# Patient Record
Sex: Female | Born: 1945 | Hispanic: No | Smoking: Never smoker
Health system: Southern US, Community
[De-identification: ages and names within clinical notes are randomized; demographics above are authoritative.]

## PROBLEM LIST (undated history)

## (undated) DIAGNOSIS — M8580 Other specified disorders of bone density and structure, unspecified site: Secondary | ICD-10-CM

## (undated) DIAGNOSIS — E0590 Thyrotoxicosis, unspecified without thyrotoxic crisis or storm: Secondary | ICD-10-CM

## (undated) DIAGNOSIS — M779 Enthesopathy, unspecified: Secondary | ICD-10-CM

## (undated) HISTORY — DX: Other specified disorders of bone density and structure, unspecified site: M85.80

## (undated) HISTORY — DX: Enthesopathy, unspecified: M77.9

---

## 1999-08-08 ENCOUNTER — Encounter: Payer: Self-pay | Admitting: Obstetrics and Gynecology

## 1999-08-08 ENCOUNTER — Encounter: Admission: RE | Admit: 1999-08-08 | Discharge: 1999-08-08 | Payer: Self-pay | Admitting: Obstetrics and Gynecology

## 2000-08-03 ENCOUNTER — Other Ambulatory Visit: Admission: RE | Admit: 2000-08-03 | Discharge: 2000-08-03 | Payer: Self-pay | Admitting: Obstetrics and Gynecology

## 2003-05-29 ENCOUNTER — Other Ambulatory Visit: Admission: RE | Admit: 2003-05-29 | Discharge: 2003-05-29 | Payer: Self-pay | Admitting: Obstetrics and Gynecology

## 2003-06-15 ENCOUNTER — Encounter: Payer: Self-pay | Admitting: Obstetrics and Gynecology

## 2003-06-15 ENCOUNTER — Encounter: Admission: RE | Admit: 2003-06-15 | Discharge: 2003-06-15 | Payer: Self-pay | Admitting: Obstetrics and Gynecology

## 2004-07-02 ENCOUNTER — Ambulatory Visit (HOSPITAL_COMMUNITY): Admission: RE | Admit: 2004-07-02 | Discharge: 2004-07-02 | Payer: Self-pay | Admitting: Obstetrics and Gynecology

## 2004-07-29 ENCOUNTER — Emergency Department (HOSPITAL_COMMUNITY): Admission: EM | Admit: 2004-07-29 | Discharge: 2004-07-29 | Payer: Self-pay | Admitting: Emergency Medicine

## 2005-12-14 ENCOUNTER — Other Ambulatory Visit: Admission: RE | Admit: 2005-12-14 | Discharge: 2005-12-14 | Payer: Self-pay | Admitting: Obstetrics and Gynecology

## 2006-01-21 ENCOUNTER — Ambulatory Visit: Payer: Self-pay | Admitting: Gastroenterology

## 2006-02-08 ENCOUNTER — Ambulatory Visit: Payer: Self-pay | Admitting: Gastroenterology

## 2007-01-11 ENCOUNTER — Ambulatory Visit (HOSPITAL_COMMUNITY): Admission: RE | Admit: 2007-01-11 | Discharge: 2007-01-11 | Payer: Self-pay | Admitting: Obstetrics and Gynecology

## 2009-04-05 ENCOUNTER — Ambulatory Visit (HOSPITAL_COMMUNITY): Admission: RE | Admit: 2009-04-05 | Discharge: 2009-04-05 | Payer: Self-pay | Admitting: Obstetrics and Gynecology

## 2009-04-17 ENCOUNTER — Encounter (INDEPENDENT_AMBULATORY_CARE_PROVIDER_SITE_OTHER): Payer: Self-pay | Admitting: Interventional Radiology

## 2009-04-17 ENCOUNTER — Other Ambulatory Visit: Admission: RE | Admit: 2009-04-17 | Discharge: 2009-04-17 | Payer: Self-pay | Admitting: Interventional Radiology

## 2009-04-17 ENCOUNTER — Encounter: Admission: RE | Admit: 2009-04-17 | Discharge: 2009-04-17 | Payer: Self-pay | Admitting: Obstetrics and Gynecology

## 2009-04-18 ENCOUNTER — Ambulatory Visit (HOSPITAL_COMMUNITY): Admission: RE | Admit: 2009-04-18 | Discharge: 2009-04-18 | Payer: Self-pay | Admitting: Obstetrics and Gynecology

## 2009-09-11 ENCOUNTER — Other Ambulatory Visit (HOSPITAL_COMMUNITY): Payer: Self-pay | Admitting: Interventional Radiology

## 2009-09-11 ENCOUNTER — Other Ambulatory Visit: Admission: RE | Admit: 2009-09-11 | Discharge: 2009-09-11 | Payer: Self-pay | Admitting: Interventional Radiology

## 2009-09-11 ENCOUNTER — Encounter: Admission: RE | Admit: 2009-09-11 | Discharge: 2009-09-11 | Payer: Self-pay | Admitting: Internal Medicine

## 2010-04-22 ENCOUNTER — Ambulatory Visit (HOSPITAL_COMMUNITY): Admission: RE | Admit: 2010-04-22 | Discharge: 2010-04-22 | Payer: Self-pay | Admitting: Obstetrics and Gynecology

## 2010-10-07 IMAGING — US US SOFT TISSUE HEAD/NECK
1 series · 13 of 25 positions shown · non-contrast
Comparison: 01/11/2007

CLINICAL DATA: Thyroid enlargement and known bilateral thyroid
nodules by prior ultrasound.

THYROID ULTRASOUND
TECHNIQUE: Ultrasound examination of the thyroid gland and
adjacent soft tissues was performed.

[Series 1: us soft tissue head/neck · 0.13mm/px · 13 of 35 slices shown]
[im 1/35]
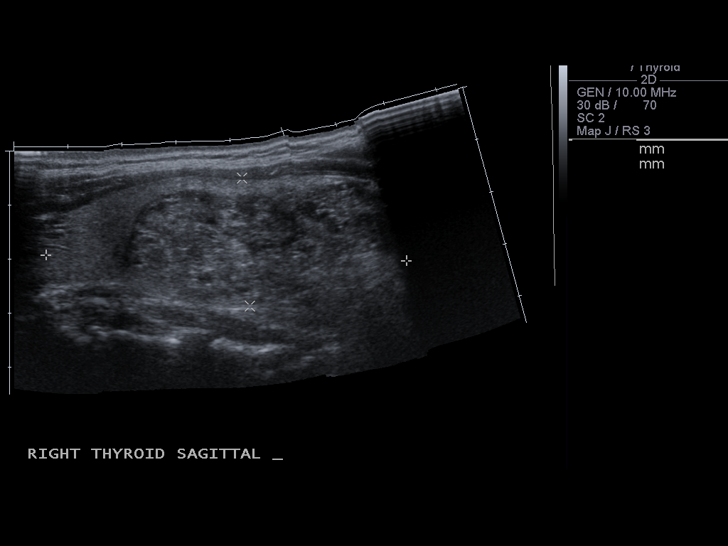
[im 3/35]
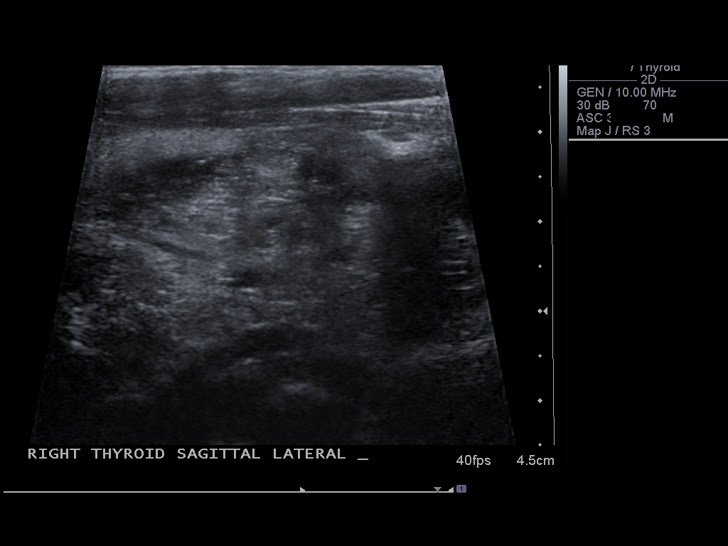
[im 6/35]
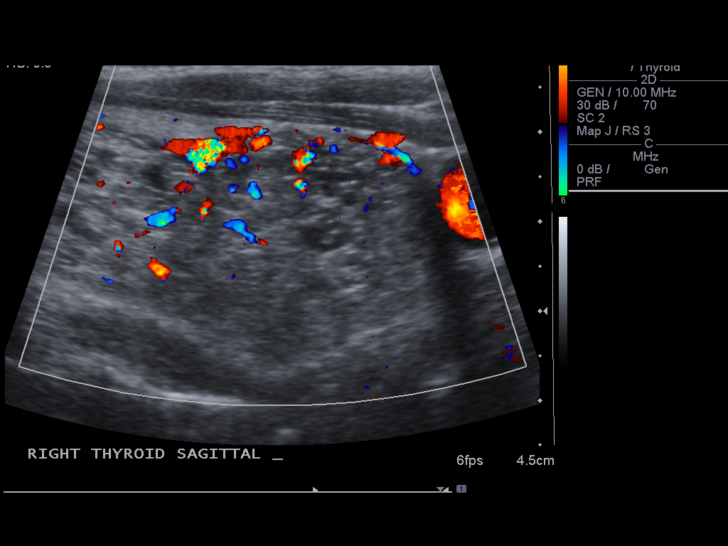
[im 9/35]
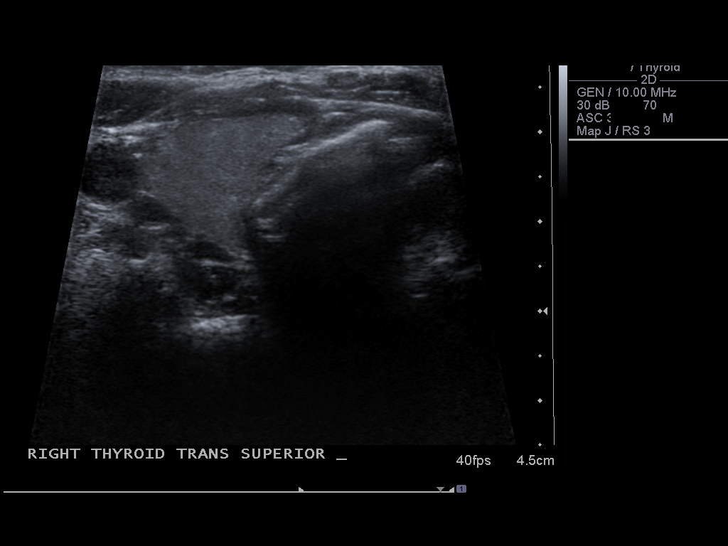
[im 12/35]
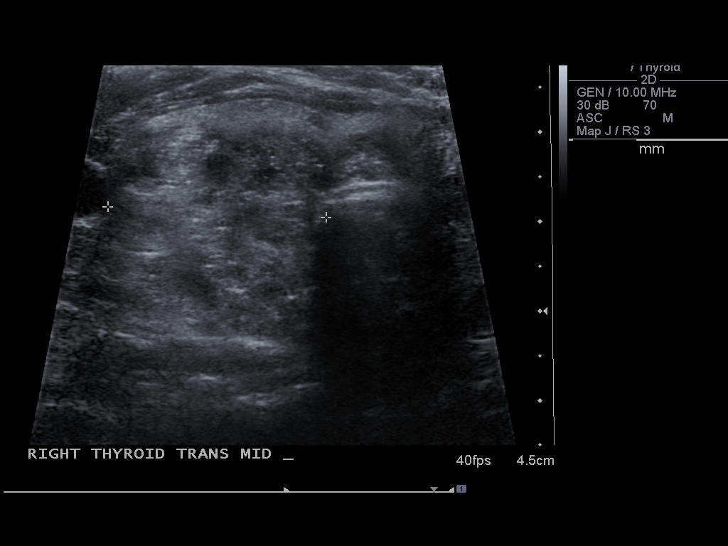
[im 15/35]
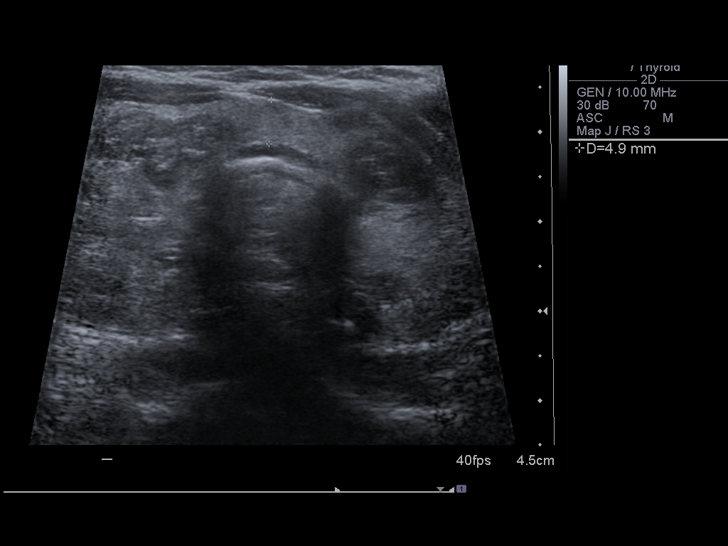
[im 18/35]
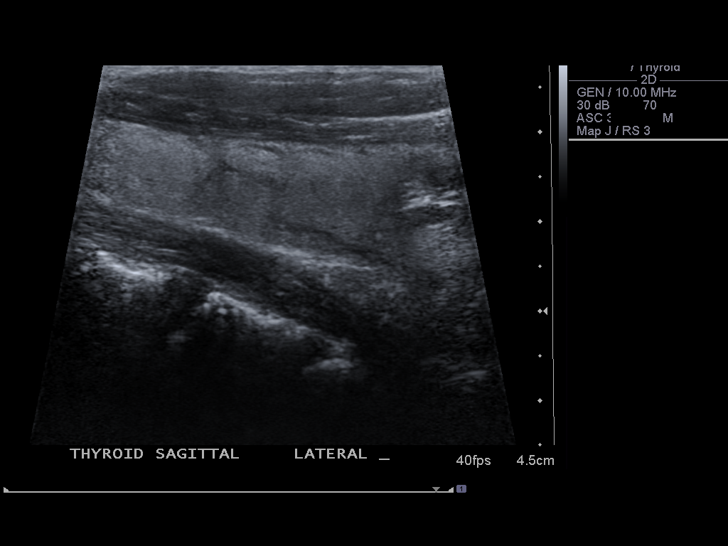
[im 20/35]
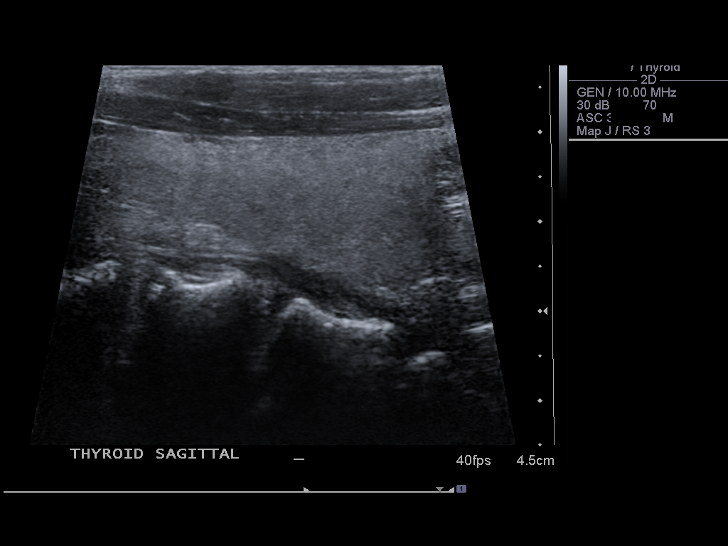
[im 23/35]
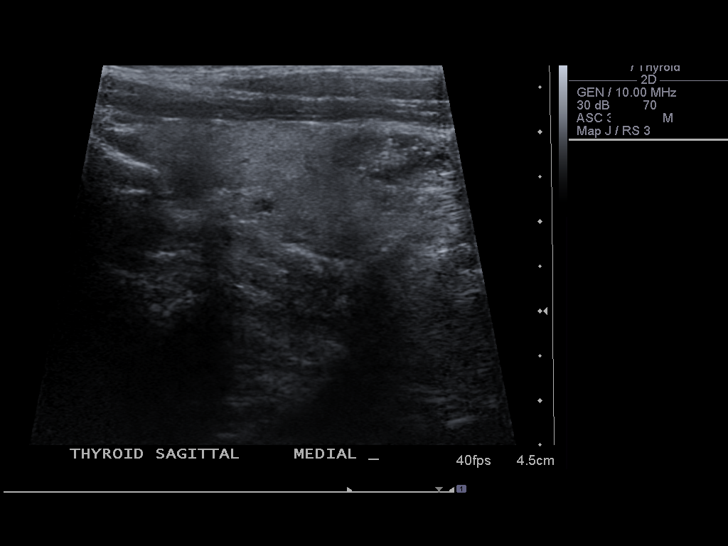
[im 26/35]
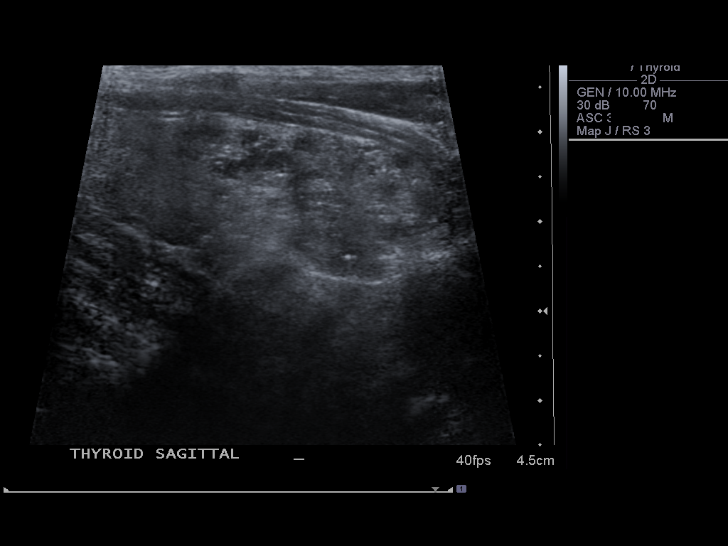
[im 29/35]
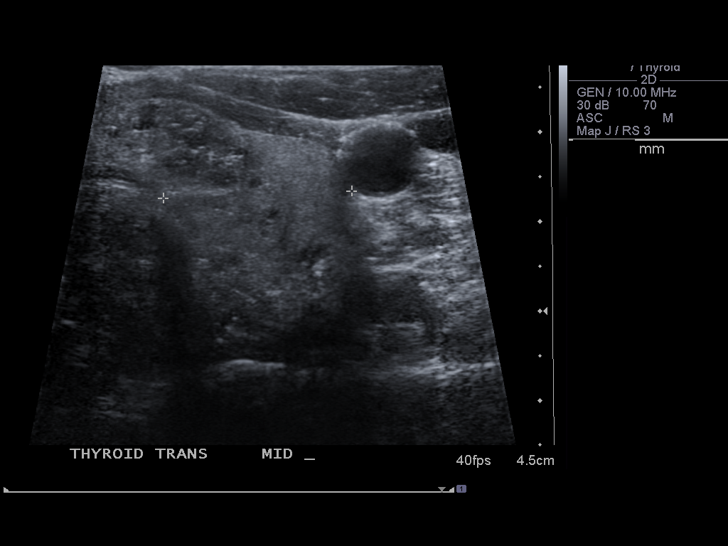
[im 32/35]
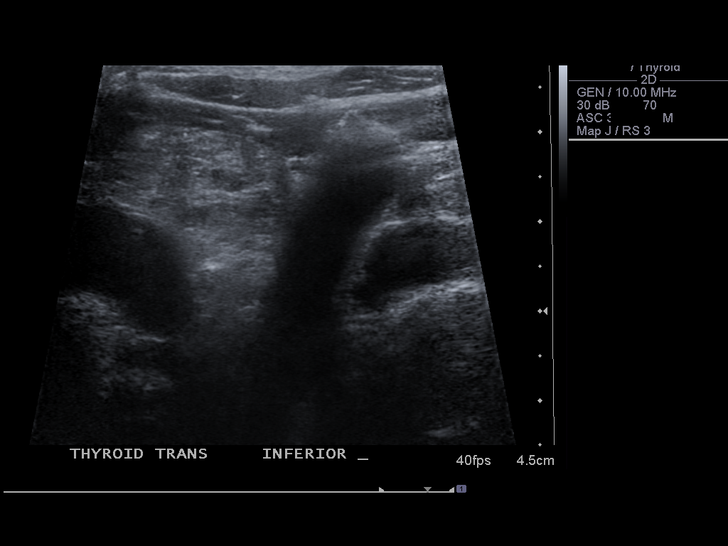
[im 35/35]
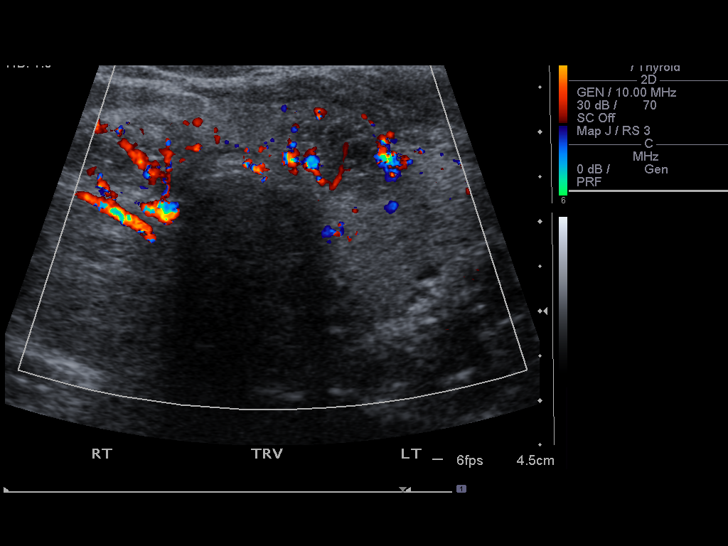

[13 of 25 positions shown; findings below may reference images not displayed]

FINDINGS: Right lobe:  6.7 x 2.4 x 2.8 cm

Left lobe:  6.1 x 1.9 x 2.1 cm

Thyroid isthmus:  0.5 cm

Due to heterogeneous nature of tissue, it is difficult to
accurately measure the dimension of thyroid nodules.  Much of the
right lobe is replaced by nodular heterogeneous tissue measuring
approximately 4.8 x 2.2 x 2.4 cm.  This area has not changed
significantly in size or appearance since the prior ultrasound
study.

There also is suggestion of additional nodularity in the left lobe.
Within the lower pole, nodularity measures approximately 3.2 x
x 2.6 cm.  There also is contiguous adjacent nodularity extending
towards the isthmus measuring approximately 1.0 x 0.5 x 0.8 cm.
Dominant nodularity in the lower left lobe does appear more
prominent compared to the prior ultrasound.
IMPRESSION: 1.  Stable large area of nodular heterogeneous tissue in the right
lobe of the liver which has not changed significantly in size or
sonographic morphology.
2.  Increased prominence of nodularity in the lower pole of the
left lobe with some adjacent nodularity extending towards the
isthmus.  Given interval enlargement, I would consider ultrasound
guided biopsy of the lower left thyroid nodule.

## 2010-10-14 ENCOUNTER — Other Ambulatory Visit: Payer: Self-pay | Admitting: Internal Medicine

## 2010-10-14 DIAGNOSIS — E042 Nontoxic multinodular goiter: Secondary | ICD-10-CM

## 2010-10-22 ENCOUNTER — Ambulatory Visit
Admission: RE | Admit: 2010-10-22 | Discharge: 2010-10-22 | Disposition: A | Discharge status: Home / Self Care | Payer: Self-pay | Source: Ambulatory Visit | Attending: Internal Medicine | Admitting: Internal Medicine

## 2010-10-22 DIAGNOSIS — E042 Nontoxic multinodular goiter: Secondary | ICD-10-CM

## 2010-12-02 ENCOUNTER — Other Ambulatory Visit: Payer: Self-pay | Admitting: Internal Medicine

## 2010-12-02 DIAGNOSIS — E041 Nontoxic single thyroid nodule: Secondary | ICD-10-CM

## 2010-12-09 ENCOUNTER — Other Ambulatory Visit (HOSPITAL_COMMUNITY)
Admission: RE | Admit: 2010-12-09 | Discharge: 2010-12-09 | Disposition: A | Discharge status: Home / Self Care | Payer: BC Managed Care – PPO | Source: Ambulatory Visit | Attending: Interventional Radiology | Admitting: Interventional Radiology

## 2010-12-09 ENCOUNTER — Other Ambulatory Visit: Payer: Self-pay | Admitting: Interventional Radiology

## 2010-12-09 ENCOUNTER — Ambulatory Visit
Admission: RE | Admit: 2010-12-09 | Discharge: 2010-12-09 | Disposition: A | Discharge status: Home / Self Care | Payer: BC Managed Care – PPO | Source: Ambulatory Visit | Attending: Internal Medicine | Admitting: Internal Medicine

## 2010-12-09 DIAGNOSIS — E049 Nontoxic goiter, unspecified: Secondary | ICD-10-CM | POA: Insufficient documentation

## 2010-12-09 DIAGNOSIS — E041 Nontoxic single thyroid nodule: Secondary | ICD-10-CM

## 2011-01-22 ENCOUNTER — Other Ambulatory Visit (HOSPITAL_COMMUNITY): Payer: Self-pay | Admitting: Obstetrics and Gynecology

## 2011-01-22 DIAGNOSIS — N83209 Unspecified ovarian cyst, unspecified side: Secondary | ICD-10-CM

## 2011-01-28 ENCOUNTER — Ambulatory Visit (HOSPITAL_COMMUNITY): Payer: BC Managed Care – PPO

## 2011-01-28 ENCOUNTER — Other Ambulatory Visit (HOSPITAL_COMMUNITY): Payer: BC Managed Care – PPO

## 2011-02-12 ENCOUNTER — Other Ambulatory Visit (HOSPITAL_COMMUNITY): Payer: BC Managed Care – PPO

## 2011-02-12 ENCOUNTER — Ambulatory Visit (HOSPITAL_COMMUNITY): Admission: RE | Admit: 2011-02-12 | Payer: BC Managed Care – PPO | Source: Ambulatory Visit

## 2011-02-17 ENCOUNTER — Encounter (INDEPENDENT_AMBULATORY_CARE_PROVIDER_SITE_OTHER): Payer: Self-pay | Admitting: Surgery

## 2011-02-17 DIAGNOSIS — Z780 Asymptomatic menopausal state: Secondary | ICD-10-CM | POA: Insufficient documentation

## 2011-02-17 DIAGNOSIS — Z973 Presence of spectacles and contact lenses: Secondary | ICD-10-CM | POA: Insufficient documentation

## 2011-02-17 DIAGNOSIS — M1990 Unspecified osteoarthritis, unspecified site: Secondary | ICD-10-CM | POA: Insufficient documentation

## 2011-03-06 ENCOUNTER — Encounter (HOSPITAL_COMMUNITY): Payer: Self-pay

## 2011-03-06 ENCOUNTER — Other Ambulatory Visit: Payer: Self-pay

## 2011-03-06 ENCOUNTER — Encounter (HOSPITAL_COMMUNITY)
Admission: RE | Admit: 2011-03-06 | Discharge: 2011-03-06 | Disposition: A | Discharge status: Home / Self Care | Payer: BC Managed Care – PPO | Source: Ambulatory Visit | Attending: Obstetrics and Gynecology | Admitting: Obstetrics and Gynecology

## 2011-03-06 HISTORY — DX: Thyrotoxicosis, unspecified without thyrotoxic crisis or storm: E05.90

## 2011-03-06 LAB — DIFFERENTIAL
Eosinophils Relative: 5 % (ref 0–5)
Lymphocytes Relative: 43 % (ref 12–46)
Lymphs Abs: 2.1 10*3/uL (ref 0.7–4.0)
Monocytes Absolute: 0.3 10*3/uL (ref 0.1–1.0)
Neutro Abs: 2.3 10*3/uL (ref 1.7–7.7)

## 2011-03-06 LAB — CBC
Hemoglobin: 11.8 g/dL — ABNORMAL LOW (ref 12.0–15.0)
MCHC: 31.1 g/dL (ref 30.0–36.0)
RBC: 4.39 MIL/uL (ref 3.87–5.11)

## 2011-03-06 LAB — SURGICAL PCR SCREEN: MRSA, PCR: NEGATIVE

## 2011-03-06 NOTE — Patient Instructions (Addendum)
21 Rebecca Galloway  03/06/2011   Your procedure is scheduled on:  Thursday   Report to [facility] at 11:00 AM.  Call this number if you have problems the morning of surgery: [phone]   Remember:   Do not eat food:After Midnight.  Do not drink clear liquids: 4 Hours before arrival.clear liquids until 8:30 am then nothing.  Take these medicines the morning of surgery with A SIP OF WATER: no  Medicines  Do not wear jewelry, make-up or nail polish.  Do not bring valuables to the hospital.  Contacts, dentures or bridgework may not be worn into surgery.  Leave suitcase in the car. After surgery it may be brought to your room.  For patients admitted to the hospital, checkout time is 11:00 AM the day of discharge.   Patients discharged the day of surgery will not be allowed to drive home.  Name and phone number of your driver: friend-Kayode [phone]  Special Instructions: use hibiclens per instruction  Please read over the following fact sheets that you were given: Surgical site infection prevention sheet

## 2011-03-11 ENCOUNTER — Encounter: Payer: Self-pay | Admitting: Obstetrics and Gynecology

## 2011-03-11 NOTE — H&P (Signed)
Rebecca Galloway, Rebecca Galloway          ACCOUNT NO.:  [id number]  MEDICAL RECORD NO.:  [id number]  LOCATION:                                 FACILITY:  PHYSICIAN:  Malachi Pro. Ambrose Mantle, M.D. DATE OF BIRTH:  01-02-1946  DATE OF ADMISSION:  03/12/2011 DATE OF DISCHARGE:                             HISTORY & PHYSICAL   HISTORY OF PRESENT ILLNESS:  This is a 65 year old white widowed female, para 3-0-1-3 who is admitted to the hospital for surgical removal of a large pelvic mass, thought to be arising from the left ovary.  This patient had two vaginal deliveries and one C-section and a D and C for incomplete abortion.  She had last period approximately 20 years ago and had essentially normal pelvic exams for 20 years until 2010 when she was noted to have a pelvic mass.  An ultrasound showed it to be an enlarged pelvic mass rather than enlarged uterus and at that time the months were 7.91 x 7.35 x 6.3 cm.  The mass was filled with echo-free fluid and smoothly marginated.  A CA-125 exam in 2011 was 14.7.  This mass persisted and two subsequent ultrasounds a yearly intervals have shown essentially no change in the mass, although the most recent ultrasound on February 12, 2011, showed that the mass had slightly enlarged to 8.46 cm x 7.96 x 6.64.  The cyst continued to be echo-free and smoothly marginated.  The patient has been advised that the studies we have suggest that this is a benign entity, but she has become quite concerned about it and wants to have it removed.  PAST MEDICAL HISTORY:  No known allergies.  MEDICATIONS:  She occasionally takes Aleve.  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  SURGICAL HISTORY:  One D and C, one C-section, two vaginal deliveries.  FAMILY HISTORY:  She does not  report any unusual family history.  She does not drink, use drugs, or smoke.  She lost her husband 5 years ago.  REVIEW OF SYSTEMS:  Negative except occasional constipation.  PHYSICAL EXAMINATION:  Well-developed, well-nourished black female in no distress.  Blood pressure is 120/80, pulse 72, weight 147 pounds.  Head, Eyes, Ears, Nose, and Throat:  Normal.  Neck:  Supple.  Thyroid: Slightly enlarged.  Breasts are soft without masses.  Lungs: Clear to auscultation.  Heart: Normal size and sounds.  No murmurs.  Abdomen: Soft and nontender.  There is no mass palpable in the abdomen.  The liver, spleen, and kidneys are normal.  Vulva and vagina are clean.  The patient was circumcised as a youth.  The cervix is in the left lateral fornix of the vagina.  The uterus is hard to feel.  The pelvic mass sits anteriorly and is 8-9 cm in diameter.  Rectal was negative on her last yearly visit in August 2011.  ADMITTING IMPRESSION:  Persistent pelvic mass.  Patient is admitted for attempted removal of the mass laparoscopically if it cannot be done laparoscopically.  She will have laparotomy.  She  does not want any additional surgery.  She does not want the contralateral ovary removed or the uterus removed.  She has been counseled about the risks of surgery, including, but not limited to heart attack, stroke, pulmonary embolus, wound disruption, hemorrhage with need for reoperation, and/or transfusion, fistula formation, nerve injury, intestinal obstruction.  She understands that there is a potential for complications and she wishes to proceed.     Malachi Pro. Ambrose Mantle, M.D.     TFH/MEDQ  D:  03/11/2011  T:  03/11/2011  Job:  [id number]

## 2011-03-12 ENCOUNTER — Other Ambulatory Visit: Payer: Self-pay | Admitting: Obstetrics and Gynecology

## 2011-03-12 ENCOUNTER — Ambulatory Visit (HOSPITAL_COMMUNITY)
Admission: RE | Admit: 2011-03-12 | Discharge: 2011-03-12 | Disposition: A | Discharge status: Home / Self Care | Payer: BC Managed Care – PPO | Source: Ambulatory Visit | Attending: Obstetrics and Gynecology | Admitting: Obstetrics and Gynecology

## 2011-03-12 ENCOUNTER — Encounter (HOSPITAL_COMMUNITY): Payer: Self-pay | Admitting: Anesthesiology

## 2011-03-12 ENCOUNTER — Ambulatory Visit (HOSPITAL_COMMUNITY): Payer: BC Managed Care – PPO | Admitting: Anesthesiology

## 2011-03-12 ENCOUNTER — Encounter (HOSPITAL_COMMUNITY)
Admission: RE | Disposition: A | Discharge status: Home / Self Care | Payer: Self-pay | Source: Ambulatory Visit | Attending: Obstetrics and Gynecology

## 2011-03-12 DIAGNOSIS — Z01818 Encounter for other preprocedural examination: Secondary | ICD-10-CM | POA: Insufficient documentation

## 2011-03-12 DIAGNOSIS — Z01812 Encounter for preprocedural laboratory examination: Secondary | ICD-10-CM | POA: Insufficient documentation

## 2011-03-12 DIAGNOSIS — N83209 Unspecified ovarian cyst, unspecified side: Secondary | ICD-10-CM | POA: Insufficient documentation

## 2011-03-12 HISTORY — PX: LAPAROSCOPY: SHX197

## 2011-03-12 LAB — COMPREHENSIVE METABOLIC PANEL
ALT: 10 U/L (ref 0–35)
Albumin: 4.1 g/dL (ref 3.5–5.2)
BUN: 13 mg/dL (ref 6–23)
Chloride: 103 mEq/L (ref 96–112)
Creatinine, Ser: 0.64 mg/dL (ref 0.50–1.10)
GFR calc non Af Amer: >60 mL/min (ref >60)
Total Bilirubin: 0.3 mg/dL (ref 0.3–1.2)

## 2011-03-12 SURGERY — LAPAROSCOPY OPERATIVE
Anesthesia: General | Site: Abdomen | Wound class: Clean Contaminated

## 2011-03-12 MED ORDER — MIDAZOLAM HCL 5 MG/5ML IJ SOLN
INTRAMUSCULAR | Status: DC | PRN
  Administered 2011-03-12: 2 mg via INTRAVENOUS

## 2011-03-12 MED ORDER — PROPOFOL 10 MG/ML IV EMUL
INTRAVENOUS | Status: AC
  Filled 2011-03-12: qty 20

## 2011-03-12 MED ORDER — FENTANYL CITRATE 0.05 MG/ML IJ SOLN
INTRAMUSCULAR | Status: DC | PRN
  Administered 2011-03-12 (×3): 100 ug via INTRAVENOUS
  Administered 2011-03-12: 50 ug via INTRAVENOUS

## 2011-03-12 MED ORDER — HEPARIN SODIUM (PORCINE) 5000 UNIT/ML IJ SOLN
5000.0000 [IU] | Freq: Once | INTRAMUSCULAR | Status: AC
  Administered 2011-03-12: 5000 [IU] via SUBCUTANEOUS

## 2011-03-12 MED ORDER — CEFAZOLIN SODIUM 1-5 GM-% IV SOLN
INTRAVENOUS | Status: DC | PRN
  Administered 2011-03-12: 2 g via INTRAVENOUS

## 2011-03-12 MED ORDER — MIDAZOLAM HCL 2 MG/2ML IJ SOLN
INTRAMUSCULAR | Status: AC
  Filled 2011-03-12: qty 2

## 2011-03-12 MED ORDER — OXYCODONE-ACETAMINOPHEN 5-325 MG PO TABS
1.0000 | ORAL_TABLET | ORAL | Status: DC | PRN
  Filled 2011-03-12: qty 1

## 2011-03-12 MED ORDER — IBUPROFEN 600 MG PO TABS
600.0000 mg | ORAL_TABLET | Freq: Four times a day (QID) | ORAL | Status: DC | PRN
  Filled 2011-03-12: qty 1

## 2011-03-12 MED ORDER — LIDOCAINE HCL (CARDIAC) 20 MG/ML IV SOLN
INTRAVENOUS | Status: AC
  Filled 2011-03-12: qty 5

## 2011-03-12 MED ORDER — HEPARIN SODIUM (PORCINE) 5000 UNIT/ML IJ SOLN
INTRAMUSCULAR | Status: AC
  Administered 2011-03-12: 5000 [IU] via SUBCUTANEOUS
  Filled 2011-03-12: qty 1

## 2011-03-12 MED ORDER — LIDOCAINE HCL (CARDIAC) 10 MG/ML IV SOLN
INTRAVENOUS | Status: DC | PRN
  Administered 2011-03-12: 80 mg via INTRAVENOUS

## 2011-03-12 MED ORDER — ROCURONIUM BROMIDE 50 MG/5ML IV SOLN
INTRAVENOUS | Status: AC
  Filled 2011-03-12: qty 1

## 2011-03-12 MED ORDER — CEFAZOLIN SODIUM 1-5 GM-% IV SOLN: 2.0000 g | Freq: Once | INTRAVENOUS | Status: DC

## 2011-03-12 MED ORDER — HEPARIN SODIUM (PORCINE) 5000 UNIT/ML IJ SOLN
INTRAMUSCULAR | Status: DC | PRN
  Administered 2011-03-12: 5000 [IU]

## 2011-03-12 MED ORDER — CEFAZOLIN SODIUM-DEXTROSE 2-3 GM-% IV SOLR
2.0000 g | Freq: Once | INTRAVENOUS | Status: DC
  Filled 2011-03-12: qty 50

## 2011-03-12 MED ORDER — ONDANSETRON HCL 4 MG/2ML IJ SOLN
INTRAMUSCULAR | Status: AC
  Filled 2011-03-12: qty 2

## 2011-03-12 MED ORDER — KETOROLAC TROMETHAMINE 60 MG/2ML IM SOLN
INTRAMUSCULAR | Status: AC
  Filled 2011-03-12: qty 2

## 2011-03-12 MED ORDER — DEXAMETHASONE SODIUM PHOSPHATE 10 MG/ML IJ SOLN
INTRAMUSCULAR | Status: DC | PRN
  Administered 2011-03-12: 10 mg via INTRAVENOUS

## 2011-03-12 MED ORDER — LACTATED RINGERS IV SOLN
INTRAVENOUS | Status: DC
  Administered 2011-03-12 (×2): via INTRAVENOUS

## 2011-03-12 MED ORDER — FENTANYL CITRATE 0.05 MG/ML IJ SOLN
INTRAMUSCULAR | Status: AC
  Filled 2011-03-12: qty 2

## 2011-03-12 MED ORDER — DEXAMETHASONE SODIUM PHOSPHATE 10 MG/ML IJ SOLN
INTRAMUSCULAR | Status: AC
  Filled 2011-03-12: qty 1

## 2011-03-12 MED ORDER — METOCLOPRAMIDE HCL 5 MG/ML IJ SOLN: 10.0000 mg | Freq: Once | INTRAMUSCULAR | Status: DC | PRN

## 2011-03-12 MED ORDER — LACTATED RINGERS IV SOLN
INTRAVENOUS | Status: DC | PRN
  Administered 2011-03-12 (×4): via INTRAVENOUS

## 2011-03-12 MED ORDER — FENTANYL CITRATE 0.05 MG/ML IJ SOLN: 25.0000 ug | INTRAMUSCULAR | Status: DC | PRN

## 2011-03-12 MED ORDER — ROCURONIUM BROMIDE 100 MG/10ML IV SOLN
INTRAVENOUS | Status: DC | PRN
  Administered 2011-03-12: 5 mg via INTRAVENOUS
  Administered 2011-03-12: 25 mg via INTRAVENOUS

## 2011-03-12 MED ORDER — LACTATED RINGERS IR SOLN
Status: DC | PRN
  Administered 2011-03-12: 3000 mL

## 2011-03-12 MED ORDER — ONDANSETRON HCL 4 MG/2ML IJ SOLN
INTRAMUSCULAR | Status: DC | PRN
  Administered 2011-03-12: 4 mg via INTRAVENOUS

## 2011-03-12 MED ORDER — FENTANYL CITRATE 0.05 MG/ML IJ SOLN
INTRAMUSCULAR | Status: AC
  Filled 2011-03-12: qty 5

## 2011-03-12 MED ORDER — OXYCODONE-ACETAMINOPHEN 5-325 MG PO TABS
ORAL_TABLET | ORAL | Status: AC
  Filled 2011-03-12: qty 1

## 2011-03-12 SURGICAL SUPPLY — 50 items
3-0 PLAIN BA353 ×3 IMPLANT
3-0 VICRYL VCP258 ×3 IMPLANT
BLADE SURG 15 STRL LF C SS BP (BLADE) ×2 IMPLANT
BLADE SURG 15 STRL SS (BLADE) ×1
CABLE HIGH FREQUENCY MONO STRZ (ELECTRODE) IMPLANT
CANISTER SUCTION 2500CC (MISCELLANEOUS) ×3 IMPLANT
CATH ROBINSON RED A/P 16FR (CATHETERS) ×3 IMPLANT
CLOTH BEACON ORANGE TIMEOUT ST (SAFETY) ×3 IMPLANT
CONTAINER PREFILL 10% NBF 15ML (MISCELLANEOUS) IMPLANT
DECANTER SPIKE VIAL GLASS SM (MISCELLANEOUS) IMPLANT
DERMABOND ADVANCED (GAUZE/BANDAGES/DRESSINGS) IMPLANT
DRAPE UTILITY XL STRL (DRAPES) ×3 IMPLANT
DRSG VASELINE 3X18 (GAUZE/BANDAGES/DRESSINGS) ×3 IMPLANT
ELECT BLADE 6 FLAT ULTRCLN (ELECTRODE) IMPLANT
FORCEPS CUTTING 33CM 5MM (CUTTING FORCEPS) ×3 IMPLANT
FORCEPS CUTTING 45CM 5MM (CUTTING FORCEPS) IMPLANT
GAUZE SPONGE 4X4 12PLY STRL LF (GAUZE/BANDAGES/DRESSINGS) ×6 IMPLANT
GAUZE SPONGE 4X4 16PLY XRAY LF (GAUZE/BANDAGES/DRESSINGS) IMPLANT
GLOVE BIO SURGEON STRL SZ7.5 (GLOVE) ×6 IMPLANT
GOWN BRE IMP SLV AUR LG STRL (GOWN DISPOSABLE) ×6 IMPLANT
GOWN BRE IMP SLV AUR XL STRL (GOWN DISPOSABLE) ×3 IMPLANT
IV LACTATED RINGER IRRG 3000ML (IV SOLUTION) ×1
IV LR IRRIG 3000ML ARTHROMATIC (IV SOLUTION) ×2 IMPLANT
NEEDLE HYPO 25X1 1.5 SAFETY (NEEDLE) IMPLANT
NEEDLE INSUFFLATION 14GA 120MM (NEEDLE) IMPLANT
NS IRRIG 1000ML POUR BTL (IV SOLUTION) ×3 IMPLANT
PACK ABDOMINAL GYN (CUSTOM PROCEDURE TRAY) IMPLANT
PACK LAPAROSCOPY BASIN (CUSTOM PROCEDURE TRAY) IMPLANT
PACK LAVH (CUSTOM PROCEDURE TRAY) ×3 IMPLANT
PAD OB MATERNITY 4.3X12.25 (PERSONAL CARE ITEMS) ×3 IMPLANT
POUCH SPECIMEN RETRIEVAL 10MM (ENDOMECHANICALS) ×6 IMPLANT
RETRACTOR WND ALEXIS 25 LRG (MISCELLANEOUS) IMPLANT
RTRCTR WOUND ALEXIS 25CM LRG (MISCELLANEOUS)
SET IRRIG TUBING LAPAROSCOPIC (IRRIGATION / IRRIGATOR) ×3 IMPLANT
SOLUTION ELECTROLUBE (MISCELLANEOUS) ×3 IMPLANT
SPONGE LAP 18X18 X RAY DECT (DISPOSABLE) IMPLANT
STAPLER VISISTAT 35W (STAPLE) ×3 IMPLANT
SUT PLAIN 3 0 FS2 (SUTURE) IMPLANT
SUT VIC AB 0 CT1 18XCR BRD8 (SUTURE) IMPLANT
SUT VIC AB 0 CT1 36 (SUTURE) IMPLANT
SUT VIC AB 0 CT1 8-18 (SUTURE)
SUT VIC AB 3-0 CTX 36 (SUTURE) ×3 IMPLANT
SUT VICRYL 0 TIES 12 18 (SUTURE) IMPLANT
SUT VICRYL 0 UR6 27IN ABS (SUTURE) ×6 IMPLANT
SYR 50ML LL SCALE MARK (SYRINGE) ×3 IMPLANT
SYR CONTROL 10ML LL (SYRINGE) IMPLANT
SYR TB 1ML 25GX5/8 (SYRINGE) ×3 IMPLANT
TOWEL OR 17X24 6PK STRL BLUE (TOWEL DISPOSABLE) ×6 IMPLANT
TRAY FOLEY CATH 14FR (SET/KITS/TRAYS/PACK) ×3 IMPLANT
WATER STERILE IRR 1000ML POUR (IV SOLUTION) ×3 IMPLANT

## 2011-03-12 NOTE — Progress Notes (Signed)
No major change in health since H&P.

## 2011-03-12 NOTE — Op Note (Signed)
Op note on Rebecca Galloway:  Date of the operation: 03/12/2011  Preoperative diagnosis:    pelvic mass thought to be a left ovarian cyst   Postoperative diagnosis: Left ovarian cyst.  Operation: Laparoscopic left salpingo-oophorectomy.  Anesthesia: Gen.  Operator:Aidaly Cordner Asst.:Bovard  The patient was brought to the operating room and placed under satisfactory general anesthesia. The vulva vagina perineum medial thighs and urethra were prepped with Betadine solution. Exam revealed a large pelvic mass to the right of the midline. A Foley catheter was inserted to straight drainage.The cervix was in the left lateral fornix and because of her very tight introitus a small speculum was used to facilitate insertion of a Hulka cannula. The surgical area was draped as a sterile field. A small incision was made in the inferior portion of the umbilicus and carried down to the fascia. The fascia was incised and a 0 Prolene suture was placed in the fascia and a Hassan trocar was placed in the peritoneal cavity. Pneumoperitoneum was established and 2 accessory trochars were placed in the abdominal cavity under direct vision at the level of the umbilicus. The patient's uterus was hard to find because of the large pelvic mass that arose from the left ovary. The left infundibulopelvic ligament and then the parametrial structures were cauterized and cut. There was no significant bleeding and the cyst was too large to fit into the bag so it was deflated with a needle and then it fit into the bag. I searched the entire pelvis for any significant bleeding and there was none. I did not see any injury to surrounding structures.The bowel appeared free of the operative field and the left ureter was free of the operative field. I should note that I aspirated the cyst of more than 100 cc of clear fluid before it would fit into the bag . Using a smaller camera through an accessory port the bag was drawn out through the  umbilical incision. I replaced the laparoscope through the umbilical incision inspected both trocar sites laterally inspected the pelvis and there was no bleeding.The right tube and ovary appeared normal as did the small atrophic uterus. I then closed the umbilical incision with 0 Prolene used a Steri-Strip on the left abdominal port skin incision and because of bleeding at a the right port,the skin was closed with suture. The Hulka cannula was removed and the procedure was terminated. Blood loss was estimated at less than 100 cc. Sponge and needle counts were correct. The patient was returned to recovery in satisfactory condition.

## 2011-03-12 NOTE — Anesthesia Preprocedure Evaluation (Signed)
Anesthesia Evaluation  Name, MR# and DOB Patient awake and Patient confused  General Assessment Comment  Reviewed: Allergy & Precautions, H&P  and Patient's Chart, lab work & pertinent test results  Airway Mallampati: II TM Distance: >3 FB Neck ROM: Full    Dental No notable dental hx (+) Teeth Intact   Pulmonaryneg pulmonary ROS    clear to auscultation  pulmonary exam normal   Cardiovascular Regular Normal   Neuro/Psych  GI/Hepatic/Renal negative GI ROS, negative Liver ROS, and negative Renal ROS (+)       Endo/Other  Negative Endocrine ROS (+)   Abdominal   Musculoskeletal negative musculoskeletal ROS (+)  Hematology negative hematology ROS (+)   Peds  Reproductive/Obstetrics negative OB ROS   Anesthesia Other Findings             Anesthesia Physical Anesthesia Plan  ASA: II  Anesthesia Plan: General   Post-op Pain Management:    Induction: Intravenous  Airway Management Planned: Oral ETT  Additional Equipment:   Intra-op Plan:   Post-operative Plan: Extubation in OR  Informed Consent: I have reviewed the patients History and Physical, chart, labs and discussed the procedure including the risks, benefits and alternatives for the proposed anesthesia with the patient or authorized representative who has indicated his/her understanding and acceptance.   Dental advisory given  Plan Discussed with: Anesthesiologist (AP)  Anesthesia Plan Comments:         Anesthesia Quick Evaluation

## 2011-03-12 NOTE — Anesthesia Postprocedure Evaluation (Signed)
  Anesthesia Post-op Note  Patient: Rebecca Galloway  Procedure(s) Performed:  LAPAROSCOPY OPERATIVE - operative laparoscopy with left salping-oophorectomy  Patient Location: PACU  Anesthesia Type: General  Level of Consciousness: awake, oriented and sedated  Airway and Oxygen Therapy: Patient Spontanous Breathing and Patient connected to nasal cannula oxygen  Post-op Pain: none  Post-op Assessment: Post-op Vital signs reviewed, Patient's Cardiovascular Status Stable, Respiratory Function Stable, Patent Airway and No signs of Nausea or vomiting  Post-op Vital Signs: Reviewed and stable  Complications: No apparent anesthesia complications

## 2011-03-12 NOTE — Transfer of Care (Deleted)
Immediate Anesthesia Transfer of Care Note  Patient: Rebecca Galloway  Procedure(s) Performed:  LAPAROSCOPY OPERATIVE - operative laparoscopy with left salping-oophorectomy  Patient Location: PACU  Anesthesia Type: General  Level of Consciousness: awake, oriented and sedated  Airway & Oxygen Therapy: Patient Spontanous Breathing and Patient connected to nasal cannula oxygen  Post-op Assessment: Report given to PACU RN, Post -op Vital signs reviewed and stable, Patient moving all extremities and Patient able to stick tongue midline  Post vital signs: Reviewed and stable  Complications: No apparent anesthesia complications

## 2011-03-12 NOTE — Transfer of Care (Signed)
Immediate Anesthesia Transfer of Care Note  Patient: Rebecca Galloway  Procedure(s) Performed:  LAPAROSCOPY OPERATIVE - operative laparoscopy with left salping-oophorectomy  Patient Location: PACU  Anesthesia Type: General  Level of Consciousness: awake, alert  and oriented  Airway & Oxygen Therapy: Patient Spontanous Breathing and Patient connected to nasal cannula oxygen  Post-op Assessment: Report given to PACU RN, Post -op Vital signs reviewed and stable, Patient moving all extremities X 4 and Patient able to stick tongue midline  Post vital signs: stable  Complications: No apparent anesthesia complications

## 2011-03-12 NOTE — Anesthesia Procedure Notes (Signed)
Procedures

## 2011-03-31 ENCOUNTER — Encounter (HOSPITAL_COMMUNITY): Payer: Self-pay | Admitting: Obstetrics and Gynecology

## 2011-05-14 ENCOUNTER — Other Ambulatory Visit (HOSPITAL_COMMUNITY): Payer: Self-pay | Admitting: Obstetrics and Gynecology

## 2011-05-14 DIAGNOSIS — Z1231 Encounter for screening mammogram for malignant neoplasm of breast: Secondary | ICD-10-CM

## 2011-05-25 ENCOUNTER — Ambulatory Visit (HOSPITAL_COMMUNITY)
Admission: RE | Admit: 2011-05-25 | Discharge: 2011-05-25 | Disposition: A | Discharge status: Home / Self Care | Payer: BC Managed Care – PPO | Source: Ambulatory Visit | Attending: Obstetrics and Gynecology | Admitting: Obstetrics and Gynecology

## 2011-05-25 DIAGNOSIS — Z1231 Encounter for screening mammogram for malignant neoplasm of breast: Secondary | ICD-10-CM | POA: Insufficient documentation

## 2012-06-13 ENCOUNTER — Other Ambulatory Visit (HOSPITAL_COMMUNITY): Payer: Self-pay | Admitting: Obstetrics and Gynecology

## 2012-06-13 DIAGNOSIS — Z1231 Encounter for screening mammogram for malignant neoplasm of breast: Secondary | ICD-10-CM

## 2012-06-24 ENCOUNTER — Ambulatory Visit (HOSPITAL_COMMUNITY)
Admission: RE | Admit: 2012-06-24 | Discharge: 2012-06-24 | Disposition: A | Discharge status: Home / Self Care | Payer: BC Managed Care – PPO | Source: Ambulatory Visit | Attending: Obstetrics and Gynecology | Admitting: Obstetrics and Gynecology

## 2012-06-24 DIAGNOSIS — Z1231 Encounter for screening mammogram for malignant neoplasm of breast: Secondary | ICD-10-CM

## 2014-12-12 ENCOUNTER — Ambulatory Visit (INDEPENDENT_AMBULATORY_CARE_PROVIDER_SITE_OTHER): Payer: BC Managed Care – PPO | Admitting: Physician Assistant

## 2014-12-12 VITALS — BP 98/63 | HR 124 | Temp 102.6°F | Ht 65.0 in | Wt 152.1 lb

## 2014-12-12 DIAGNOSIS — J101 Influenza due to other identified influenza virus with other respiratory manifestations: Secondary | ICD-10-CM | POA: Diagnosis not present

## 2014-12-12 DIAGNOSIS — R6889 Other general symptoms and signs: Secondary | ICD-10-CM | POA: Diagnosis not present

## 2014-12-12 LAB — POCT INFLUENZA A/B
INFLUENZA B, POC: NEGATIVE
Influenza A, POC: POSITIVE

## 2014-12-12 MED ORDER — ACETAMINOPHEN 500 MG PO TABS
500.0000 mg | ORAL_TABLET | Freq: Four times a day (QID) | ORAL | Status: AC | PRN
Start: 2014-12-12 — End: ?

## 2014-12-12 MED ORDER — ACETAMINOPHEN 325 MG PO TABS: 1000.0000 mg | ORAL_TABLET | Freq: Once | ORAL | Status: AC

## 2014-12-12 MED ORDER — OSELTAMIVIR PHOSPHATE 75 MG PO CAPS: 75.0000 mg | ORAL_CAPSULE | Freq: Two times a day (BID) | ORAL | Status: DC

## 2014-12-12 NOTE — Progress Notes (Signed)
12/12/2014 at 8:00 PM  Rebecca Galloway / DOB: 1946-02-19 / MRN: [id number]  The patient has Wears glasses; Arthritis; and Menopause on her problem list.  SUBJECTIVE  Chief complaint: Fever; Generalized Body Aches; and Cough   History of present illness: Rebecca Galloway is 69 y.o. ill appearing female presenting for cough, fever, and body aches times 3 days.  She has tried nothing for her symptoms.  She had the flu shot in October of 2015. She has been eating and drinking normally, denies belly pain, nausea, and emesis. She denies SOB and DOE.    She  has a past medical history of Hyperthyroidism.    She has a current medication list which includes the following prescription(s): fexofenadine, naproxen sodium.  Rebecca Galloway has No Known Allergies. She  reports that she has never smoked. She does not have any smokeless tobacco history on file. She reports that she does not drink alcohol or use illicit drugs. She  has no sexual activity history on file. The patient  has past surgical history that includes Cesarean section (1986); Cesarean section; and laparoscopy (03/12/2011).  Her family history includes Vision loss in her father.  ROS  Per HPI  OBJECTIVE  Her  height is 5\' 5"  (1.651 m) and weight is 152 lb 2 oz (69.003 kg). Her oral temperature is 102.6 F (39.2 C). Her blood pressure is 98/63 and her pulse is 124. Her oxygen saturation is 94%.  The patient's body mass index is 25.31 kg/(m^2).  Physical Exam  Constitutional: She is oriented to person, place, and time. She appears well-developed and well-nourished. No distress.  HENT:  Right Ear: Hearing, tympanic membrane, external ear and ear canal normal.  Left Ear: Hearing, tympanic membrane, external ear and ear canal normal.  Nose: Mucosal edema present. Right sinus exhibits no maxillary sinus tenderness and no frontal sinus tenderness. Left sinus exhibits no maxillary sinus tenderness and no frontal sinus tenderness.    Mouth/Throat: Uvula is midline, oropharynx is clear and moist and mucous membranes are normal.  Cardiovascular: Normal rate, regular rhythm and normal heart sounds.   Respiratory: Effort normal and breath sounds normal. She has no wheezes. She has no rales.  Neurological: She is alert and oriented to person, place, and time.  Skin: Skin is warm and dry. She is not diaphoretic.  Psychiatric: She has a normal mood and affect.    Results for orders placed or performed in visit on 12/12/14 (from the past 24 hour(s))  POCT Influenza A/B     Status: None   Collection Time: 12/12/14  7:32 PM  Result Value Ref Range   Influenza A, POC Positive    Influenza B, POC Negative     ASSESSMENT & PLAN  Rebecca Galloway was seen today for fever, generalized body aches and cough.  Diagnoses and all orders for this visit:  Flu-like symptoms Orders: -     POCT Influenza A/B -     acetaminophen (TYLENOL) tablet 975 mg; Take 3 tablets (975 mg total) by mouth once. -     Orthostatic vital signs; Standing -     Orthostatic vital signs -     acetaminophen (TYLENOL) 500 MG tablet; Take 1 tablet (500 mg total) by mouth every 6 (six) hours as needed.  Influenza A Orders: -     oseltamivir (TAMIFLU) 75 MG capsule; Take 1 capsule (75 mg total) by mouth 2 (two) times daily.    The patient was advised to call or come back  to clinic if she does not see an improvement in symptoms, or worsens with the above plan.   Philis Fendt, MHS, PA-C [facility] 12/12/2014 8:00 PM

## 2014-12-12 NOTE — Patient Instructions (Signed)
Drink at least one gallon of water or fluid of your choice tonight, and continue to drink a gallon of fluid daily for the next three or four days.    Influenza Influenza ("the flu") is a viral infection of the respiratory tract. It occurs more often in winter months because people spend more time in close contact with one another. Influenza can make you feel very sick. Influenza easily spreads from person to person (contagious). CAUSES  Influenza is caused by a virus that infects the respiratory tract. You can catch the virus by breathing in droplets from an infected person's cough or sneeze. You can also catch the virus by touching something that was recently contaminated with the virus and then touching your mouth, nose, or eyes. RISKS AND COMPLICATIONS You may be at risk for a more severe case of influenza if you smoke cigarettes, have diabetes, have chronic heart disease (such as heart failure) or lung disease (such as asthma), or if you have a weakened immune system. Elderly people and pregnant women are also at risk for more serious infections. The most common problem of influenza is a lung infection (pneumonia). Sometimes, this problem can require emergency medical care and may be life threatening. SIGNS AND SYMPTOMS  Symptoms typically last 4 to 10 days and may include:  Fever.  Chills.  Headache, body aches, and muscle aches.  Sore throat.  Chest discomfort and cough.  Poor appetite.  Weakness or feeling tired.  Dizziness.  Nausea or vomiting. DIAGNOSIS  Diagnosis of influenza is often made based on your history and a physical exam. A nose or throat swab test can be done to confirm the diagnosis. TREATMENT  In mild cases, influenza goes away on its own. Treatment is directed at relieving symptoms. For more severe cases, your health care provider may prescribe antiviral medicines to shorten the sickness. Antibiotic medicines are not effective because the infection is caused by  a virus, not by bacteria. HOME CARE INSTRUCTIONS  Take medicines only as directed by your health care provider.  Use a cool mist humidifier to make breathing easier.  Get plenty of rest until your temperature returns to normal. This usually takes 3 to 4 days.  Drink enough fluid to keep your urine clear or pale yellow.  Cover yourmouth and nosewhen coughing or sneezing,and wash your handswellto prevent thevirusfrom spreading.  Stay homefromwork orschool untilthe fever is gonefor at least 38full day. PREVENTION  An annual influenza vaccination (flu shot) is the best way to avoid getting influenza. An annual flu shot is now routinely recommended for all adults in the [place] IF:  You experiencechest pain, yourcough worsens,or you producemore mucus.  Youhave nausea,vomiting, ordiarrhea.  Your fever returns or gets worse. SEEK IMMEDIATE MEDICAL CARE IF:  You havetrouble breathing, you become short of breath,or your skin ornails becomebluish.  You have severe painor stiffnessin the neck.  You develop a sudden headache, or pain in the face or ear.  You have nausea or vomiting that you cannot control. MAKE SURE YOU:   Understand these instructions.  Will watch your condition.  Will get help right away if you are not doing well or get worse. Document Released: 08/14/2000 Document Revised: 01/01/2014 Document Reviewed: 11/16/2011 [facility] Patient Information 2015 [place], [place]. This information is not intended to replace advice given to you by your health care provider. Make sure you discuss any questions you have with your health care provider.

## 2014-12-23 ENCOUNTER — Emergency Department (HOSPITAL_COMMUNITY)
Admission: EM | Admit: 2014-12-23 | Discharge: 2014-12-23 | Disposition: A | Discharge status: Home / Self Care | Payer: BC Managed Care – PPO | Attending: Emergency Medicine | Admitting: Emergency Medicine

## 2014-12-23 ENCOUNTER — Emergency Department (HOSPITAL_COMMUNITY): Payer: BC Managed Care – PPO

## 2014-12-23 ENCOUNTER — Encounter (HOSPITAL_COMMUNITY): Payer: Self-pay | Admitting: *Deleted

## 2014-12-23 DIAGNOSIS — J09X2 Influenza due to identified novel influenza A virus with other respiratory manifestations: Secondary | ICD-10-CM | POA: Diagnosis not present

## 2014-12-23 DIAGNOSIS — R000 Tachycardia, unspecified: Secondary | ICD-10-CM | POA: Diagnosis not present

## 2014-12-23 DIAGNOSIS — Z8639 Personal history of other endocrine, nutritional and metabolic disease: Secondary | ICD-10-CM | POA: Insufficient documentation

## 2014-12-23 DIAGNOSIS — R059 Cough, unspecified: Secondary | ICD-10-CM

## 2014-12-23 DIAGNOSIS — Z79899 Other long term (current) drug therapy: Secondary | ICD-10-CM | POA: Insufficient documentation

## 2014-12-23 DIAGNOSIS — R509 Fever, unspecified: Secondary | ICD-10-CM | POA: Diagnosis present

## 2014-12-23 DIAGNOSIS — R05 Cough: Secondary | ICD-10-CM

## 2014-12-23 DIAGNOSIS — J101 Influenza due to other identified influenza virus with other respiratory manifestations: Secondary | ICD-10-CM

## 2014-12-23 LAB — BASIC METABOLIC PANEL
Anion gap: 8 (ref 5–15)
BUN: 9 mg/dL (ref 6–23)
CHLORIDE: 105 mmol/L (ref 96–112)
CO2: 26 mmol/L (ref 19–32)
Calcium: 9.1 mg/dL (ref 8.4–10.5)
Creatinine, Ser: 0.72 mg/dL (ref 0.50–1.10)
GFR calc Af Amer: >90 mL/min (ref >90)
GFR, EST NON AFRICAN AMERICAN: 86 mL/min — AB (ref >90)
GLUCOSE: 105 mg/dL — AB (ref 70–99)
POTASSIUM: 4 mmol/L (ref 3.5–5.1)
Sodium: 139 mmol/L (ref 135–145)

## 2014-12-23 LAB — URINALYSIS, ROUTINE W REFLEX MICROSCOPIC
BILIRUBIN URINE: NEGATIVE
GLUCOSE, UA: NEGATIVE mg/dL
KETONES UR: NEGATIVE mg/dL
LEUKOCYTES UA: NEGATIVE
NITRITE: NEGATIVE
PH: 6.5 (ref 5.0–8.0)
PROTEIN: NEGATIVE mg/dL
SPECIFIC GRAVITY, URINE: 1.012 (ref 1.005–1.030)
Urobilinogen, UA: 0.2 mg/dL (ref 0.0–1.0)

## 2014-12-23 LAB — CBC WITH DIFFERENTIAL/PLATELET
BASOS ABS: 0 10*3/uL (ref 0.0–0.1)
Basophils Relative: 0 % (ref 0–1)
EOS ABS: 0.5 10*3/uL (ref 0.0–0.7)
Eosinophils Relative: 7 % — ABNORMAL HIGH (ref 0–5)
HCT: 36.4 % (ref 36.0–46.0)
HEMOGLOBIN: 11.5 g/dL — AB (ref 12.0–15.0)
LYMPHS ABS: 2 10*3/uL (ref 0.7–4.0)
LYMPHS PCT: 33 % (ref 12–46)
MCH: 26.6 pg (ref 26.0–34.0)
MCHC: 31.6 g/dL (ref 30.0–36.0)
MCV: 84.1 fL (ref 78.0–100.0)
Monocytes Absolute: 0.4 10*3/uL (ref 0.1–1.0)
Monocytes Relative: 7 % (ref 3–12)
Neutro Abs: 3.2 10*3/uL (ref 1.7–7.7)
Neutrophils Relative %: 53 % (ref 43–77)
PLATELETS: 285 10*3/uL (ref 150–400)
RBC: 4.33 MIL/uL (ref 3.87–5.11)
RDW: 14.8 % (ref 11.5–15.5)
WBC: 6.1 10*3/uL (ref 4.0–10.5)

## 2014-12-23 LAB — I-STAT CG4 LACTIC ACID, ED: Lactic Acid, Venous: 0.99 mmol/L (ref 0.5–2.0)

## 2014-12-23 LAB — URINE MICROSCOPIC-ADD ON

## 2014-12-23 MED ORDER — IBUPROFEN 800 MG PO TABS
800.0000 mg | ORAL_TABLET | Freq: Once | ORAL | Status: AC
  Administered 2014-12-23: 800 mg via ORAL
  Filled 2014-12-23: qty 1

## 2014-12-23 NOTE — Discharge Instructions (Signed)
You may alternate between 650 mg of Tylenol every 6 hours and 600 mg of ibuprofen every 6 hours as needed for fever and pain. Please continue to replace fluid. Please rest.   Influenza Influenza ("the flu") is a viral infection of the respiratory tract. It occurs more often in winter months because people spend more time in close contact with one another. Influenza can make you feel very sick. Influenza easily spreads from person to person (contagious). CAUSES  Influenza is caused by a virus that infects the respiratory tract. You can catch the virus by breathing in droplets from an infected person's cough or sneeze. You can also catch the virus by touching something that was recently contaminated with the virus and then touching your mouth, nose, or eyes. RISKS AND COMPLICATIONS You may be at risk for a more severe case of influenza if you smoke cigarettes, have diabetes, have chronic heart disease (such as heart failure) or lung disease (such as asthma), or if you have a weakened immune system. Elderly people and pregnant women are also at risk for more serious infections. The most common problem of influenza is a lung infection (pneumonia). Sometimes, this problem can require emergency medical care and may be life threatening. SIGNS AND SYMPTOMS  Symptoms typically last 4 to 10 days and may include:  Fever.  Chills.  Headache, body aches, and muscle aches.  Sore throat.  Chest discomfort and cough.  Poor appetite.  Weakness or feeling tired.  Dizziness.  Nausea or vomiting. DIAGNOSIS  Diagnosis of influenza is often made based on your history and a physical exam. A nose or throat swab test can be done to confirm the diagnosis. TREATMENT  In mild cases, influenza goes away on its own. Treatment is directed at relieving symptoms. For more severe cases, your health care provider may prescribe antiviral medicines to shorten the sickness. Antibiotic medicines are not effective because  the infection is caused by a virus, not by bacteria. HOME CARE INSTRUCTIONS  Take medicines only as directed by your health care provider.  Use a cool mist humidifier to make breathing easier.  Get plenty of rest until your temperature returns to normal. This usually takes 3 to 4 days.  Drink enough fluid to keep your urine clear or pale yellow.  Cover yourmouth and nosewhen coughing or sneezing,and wash your handswellto prevent thevirusfrom spreading.  Stay homefromwork orschool untilthe fever is gonefor at least 72full day. PREVENTION  An annual influenza vaccination (flu shot) is the best way to avoid getting influenza. An annual flu shot is now routinely recommended for all adults in the [place] IF:  You experiencechest pain, yourcough worsens,or you producemore mucus.  Youhave nausea,vomiting, ordiarrhea.  Your fever returns or gets worse. SEEK IMMEDIATE MEDICAL CARE IF:  You havetrouble breathing, you become short of breath,or your skin ornails becomebluish.  You have severe painor stiffnessin the neck.  You develop a sudden headache, or pain in the face or ear.  You have nausea or vomiting that you cannot control. MAKE SURE YOU:   Understand these instructions.  Will watch your condition.  Will get help right away if you are not doing well or get worse. Document Released: 08/14/2000 Document Revised: 01/01/2014 Document Reviewed: 11/16/2011 [facility] Patient Information 2015 [place], [place]. This information is not intended to replace advice given to you by your health care provider. Make sure you discuss any questions you have with your health care provider.    Emergency Department Resource Guide  1) Find a Tax adviser and Pay Out of Pocket Although you won't have to find out who is covered by your insurance plan, it is a good idea to ask around and get recommendations. You will then need to call the office and see if the doctor  you have chosen will accept you as a new patient and what types of options they offer for patients who are self-pay. Some doctors offer discounts or will set up payment plans for their patients who do not have insurance, but you will need to ask so you aren't surprised when you get to your appointment.  2) Contact Your Local Health Department Not all health departments have doctors that can see patients for sick visits, but many do, so it is worth a call to see if yours does. If you don't know where your local health department is, you can check in your phone book. The CDC also has a tool to help you locate your state's health department, and many state websites also have listings of all of their local health departments.  3) Find a [facility] If your illness is not likely to be very severe or complicated, you may want to try a walk in clinic. These are popping up all over the country in pharmacies, drugstores, and shopping centers. They're usually staffed by nurse practitioners or physician assistants that have been trained to treat common illnesses and complaints. They're usually fairly quick and inexpensive. However, if you have serious medical issues or chronic medical problems, these are probably not your best option.  No Primary Care Doctor: - Call Health Connect at  [phone] - they can help you locate a primary care doctor that  accepts your insurance, provides certain services, etc. - Physician Referral Service- [phone]  Chronic Pain Problems: Organization         Address  Phone   Notes  [facility]  [phone] Patients need to be referred by their primary care doctor.   Medication Assistance: Organization         Address  Phone   Notes  [facility] Medication [facility] [address] [place], [place] [address] [phone] --Must be a resident of [facility] -- Must have NO insurance coverage whatsoever (no Medicaid/  Medicare, etc.) -- The pt. MUST have a primary care doctor that directs their care regularly and follows them in the community   MedAssist  [phone]   [employer]  [phone]    Agencies that provide inexpensive medical care: Organization         Address  Phone   Notes  [place]  [phone]   [facility]    [phone]   [facility] [place],  [phone]   [facility] [address], [place] [phone]   Planned Parenthood    [phone]   [facility]    [phone]   [place] and [place] [address], [place] Phone:  [phone], Fax:  [phone] Hours of Operation:  9 am - 6 pm, M-F.  Also accepts Medicaid/Medicare and self-pay.  [facility] for [place], [address],  Phone: [phone], Fax: [phone]. Hours of Operation:  8:30 am - 5:30 pm, M-F.  Also accepts Medicaid and self-pay.  [facility] [place] [address] [place] Phone: [phone]   [place], [place], [place] [phone], Ext. [phone] Mondays & Thursdays: 7-9 AM.  First 15 patients are seen on a first come, first serve basis.    [place] Providers:  Organization         Address  Phone   Notes  [facility] [address], [place] [phone] Also accepts self-pay patients.  [facility] [address], [place]  [phone]   [place], [address], [place] [phone]   [facility] [address], [place] [phone]   Lucianne Lei [address], [place]   [phone] Only accepts [place] Access [place] patients after they have their name applied to their card.    Self-Pay (no insurance) in [facility]:  Organization         Address  Phone   Notes  Sickle Cell Patients, [facility] [place] [phone]   [facility] [place] [phone]   [facility] [place]  [place], [place], [place] [phone]   [facility]/Dr. Osei-Bonsu  [address] [place] or [address], [place] [phone] Phone number for both [place] and [place] locations is the same.  Urgent Medical and [facility] [address] [place] [phone]   [facility] [address], [place] or [address] [phone]   [facility] [address] [place] [phone], phone; [phone], fax Sees patients 1st and 3rd Saturday of every month.  Must not qualify for public or private insurance (i.e. Medicaid, Medicare, [place] Health Choice, Veterans' Benefits)  Household income should be no more than 200% of the poverty level The clinic cannot treat you if you are pregnant or think you are pregnant  Sexually transmitted diseases are not treated at the clinic.    Dental Care: Organization         Address  Phone  Notes  [facility] Department of [facility] [place] [phone] Accepts children up to age 7 who are enrolled in [place] or [place]; pregnant women with a Medicaid card; and children who have applied for Medicaid or [employer] Health Choice, but were declined, whose parents can pay a reduced fee at time of service.  [facility] Department of [facility]  [address], [place] [phone] Accepts children up to age 78 who are enrolled in [place] or [place]; pregnant women with a Medicaid card; and children who have applied for Medicaid or [place] Health Choice,  but were declined, whose parents can pay a reduced fee at time of service.  [facility] Access PRO[facility]  [place] [phone] Patients are seen by appointment only. Walk-ins are not accepted. [place] will see patients 16 years of age and older. Monday - Tuesday (8am-5pm) Most Wednesdays (8:30-5pm) $30 per visit, cash only  [facility] Adult Dental Access PROGRAM  [address], [facility] [phone] Patients are seen by appointment only. Walk-ins are not accepted. [place] will see patients 45 years of age and older. One Wednesday Evening (Monthly:  Volunteer Based).  $30 per visit, cash only  [facility]  [phone] for adults; Children under age 80, call Graduate Pediatric Dentistry at [phone]. Children aged 40-14, please call [phone] to request a pediatric application.  Dental services are provided in all areas of dental care including fillings, crowns and bridges, complete and partial dentures, implants, gum treatment, root canals, and extractions. Preventive care is also provided. Treatment is provided to both adults and children. Patients are selected via a lottery and there is often a waiting list.   [facility] [address], [place]  [phone] [email or web address]   [facility] [address] [place], [place] [phone], Ext. [phone] Second and Fourth Thursday of each month, opens at 6:30 AM; Clinic ends at 9 AM.  Patients are seen on a first-come first-served basis, and a limited number are seen during each clinic.   [facility]  [address] Hillard Danker [place], [place] [phone]   Eligibility Requirements You must have lived in [place], [place], or [place] counties for at least the last three months.   You cannot be eligible for state or federal sponsored [employer], including [employer], [place], or [employer].   You generally  cannot be eligible for healthcare insurance through your employer.    How to apply: Eligibility screenings are held every Tuesday and Wednesday afternoon from 1:00 pm until 4:00 pm. You do not need an appointment for the interview!  [facility] [address] [place], [place]   [place]  [place] Department  [place]  [phone]    Behavioral Health Resources in the Community: Intensive Outpatient Programs Organization         Address  Phone  Notes  [place] [place]. [address] [place], [place] [phone]   [facility] Outpatient [address] [place], [place]   ADS: [facility] Drug S[facility] [address], [place], [place]   [place] [address]  [place], [place] or [phone]   Substance Abuse Resources Organization         Address  Phone  Notes  Alcohol and Drug Services  [phone]   [place]  [phone]   [facility]   Chinita Pester  [phone]   Residential & Outpatient Substance Abuse Program  [phone]   Psychological Services Organization         Address  Phone  Notes  [facility] [place]  [place]  [phone]   [place] [address], [place] or [phone]    Mobile Crisis Teams Organization         Address  Phone  Notes  [facility], Mobile Crisis Care Unit  [phone]   [facility]  [address] [place], [place]   [place] Levels [address] [place] [phone]    Self-Help/Support Groups Organization         Address  Phone             Notes  [place]. of [place] - variety of support groups  [place] Call for more  information  Narcotics Anonymous (NA), [facility] [address], [employer] [place]  2 meetings at this location   Residential  Treatment Programs Organization         Address  Phone  Notes  [facility] [address]    [place]  [phone]   [facility]  [address] [place] [phone], [place], [place]   [place], [place] [phone] Admissions: 8am-3pm M-F  Incentives Substance [place] [address]    [place], [place] [phone]   [facility] [address] [place], [place], [place]   [facility] [address]  [place], [place]   [facility] - Intensive Outpatient [address] Kristeen [address], [place], [place]   [facility] ([place].) [place].,  [place], [place] [phone] or [phone]   [facility]idential Treatment Services (RTS) [address] [place], [place] Accepts Medicaid  [place] [address]  Presidential [place] [phone] Substance Abuse/Addiction Treatment   [facility] Organization         Address  Phone  Notes  [facility]  [phone]   Domenic Schwab, PhD [address] Arlis [place], [place]   [phone] or [phone]   [facility] [place], [place] [phone]   [facility] [address] [place], [place] [phone] Insurance/Medicaid/sponsorship through [facility] and Families [address], [address] [place]                                    [place], [place] [phone] [place] [address], [place] [phone]    Dr. Adele Schilder  [phone]   [facility] Dept. 1) [address], [place] 2) [place] 3)  [place] [address], [place] [phone]  [phone]   [place] [phone] or [phone] (After Hours)

## 2014-12-23 NOTE — ED Notes (Addendum)
Medical Student at the bedside   

## 2014-12-23 NOTE — ED Provider Notes (Signed)
TIME SEEN: 11:10 AM  CHIEF COMPLAINT: Intermittent fever, cough, body aches  HPI: Pt is a 69 y.o. female with no cigarette past medical history who presents to the emergency department with complaints of 2 weeks of fever, cough, body aches and burning sensation of her tongue. Was seen at urgent care on April 13 and was positive for influenza type A. Was prescribed Tamiflu but never filled this medication. She did receive an influenza vaccination October 2015. 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 No known sick contacts. No recent international travel. No headache or neck pain. No rash. No vomiting or diarrhea. No chest pain or shortness of breath. States her last measured fever was 1 week ago. States her cough has turned into clear sputum production. No dysuria or hematuria. Denies any tick bites.  ROS: See HPI Constitutional: Subjective fever  Eyes: no drainage  ENT: no runny nose   Cardiovascular:  no chest pain  Resp: no SOB  GI: no vomiting GU: no dysuria Integumentary: no rash  Allergy: no hives  Musculoskeletal: no leg swelling  Neurological: no slurred speech ROS otherwise negative  PAST MEDICAL HISTORY/PAST SURGICAL HISTORY:  Past Medical History  Diagnosis Date  . Hyperthyroidism     pt states is presently being worked up and was told she has hyperthyroidism, no meds    MEDICATIONS:  Prior to Admission medications   Medication Sig Start Date End Date Taking? Authorizing Provider  acetaminophen (TYLENOL) 500 MG tablet Take 1 tablet (500 mg total) by mouth every 6 (six) hours as needed. 12/12/14   Tereasa Coop, PA-C  fexofenadine (ALLEGRA) 180 MG tablet Take 180 mg by mouth daily.     Historical Provider, MD  naproxen sodium (ANAPROX) 220 MG tablet Take 220 mg by mouth as needed.      Historical Provider, MD  oseltamivir (TAMIFLU) 75 MG capsule Take 1 capsule (75 mg total) by mouth 2 (two) times daily. 12/12/14   Tereasa Coop, PA-C    ALLERGIES:   No Known Allergies  SOCIAL HISTORY:  History  Substance Use Topics  . Smoking status: Never Smoker   . Smokeless tobacco: Not on file  . Alcohol Use: No    FAMILY HISTORY: Family History  Problem Relation Age of Onset  . Vision loss Father     Glaucoma    EXAM: BP 154/114 mmHg  Pulse 105  Temp(Src) 99.5 F (37.5 C) (Oral)  Resp 18  Ht 5\' 4"  (1.626 m)  Wt 152 lb (68.947 kg)  BMI 26.08 kg/m2  SpO2 100% CONSTITUTIONAL: Alert and oriented and responds appropriately to questions. Well-appearing; well-nourished, very well-appearing, nontoxic, smiling, pleasant HEAD: Normocephalic EYES: Conjunctivae clear, PERRL ENT: normal nose; no rhinorrhea; moist mucous membranes; pharynx without lesions noted NECK: Supple, no meningismus, no LAD  CARD: Regular and tachycardic; S1 and S2 appreciated; no murmurs, no clicks, no rubs, no gallops RESP: Normal chest excursion without splinting or tachypnea; breath sounds clear and equal bilaterally; no wheezes, no rhonchi, no rales, good aeration diffusely, no hypoxia, peaking in full sentences ABD/GI: Normal bowel sounds; non-distended; soft, non-tender, no rebound, no guarding BACK:  The back appears normal and is non-tender to palpation, there is no CVA tenderness EXT: Normal ROM in all joints; non-tender to palpation; no edema; normal capillary refill; no cyanosis    SKIN: Normal color for age and race; warm, no rash NEURO: Moves all extremities equally PSYCH: The patient's mood and manner are appropriate. Grooming and  personal hygiene are appropriate.  MEDICAL DECISION MAKING: Patient here with prolonged flulike symptoms. Did test positive for the flu on April 13. Did not take Tamiflu. She is very well-appearing on exam, nontoxic. We'll check chest x-ray and urinalysis to ensure there is no other sign of bacterial infection. Will treat symptomatically with ibuprofen, fluids. We'll check basic blood work. Anticipate if workup is unremarkable  she can be discharged home.  ED PROGRESS: Patient's workup in the emergency department has been unremarkable. Her heart rate and blood pressure have improved. She did not have an abnormal rectal temperature. No leukocytosis. Normal lactate. Urine shows trace hemoglobin but no other sign of infection. Chest x-ray clear. Suspect her symptoms are from her recent diagnosis of influenza type A. She is outside of any treatment window for Tamiflu. Have advised her to use Tylenol and ibuprofen and alternate between the 2, increase fluid intake and rest. Discussed return precautions. She is also requesting that we provide her with information for outpatient PCP follow-up. She verbalized understanding and is comfortable with plan.     [place], DO 12/23/14 1335

## 2014-12-23 NOTE — ED Notes (Signed)
Phlebotomy at the bedside  

## 2014-12-23 NOTE — ED Notes (Addendum)
Pt reports feeling sick x 2 weeks. Reports intermittent fever, productive cough, bodyaches and "burning sensation in her mouth."

## 2014-12-23 NOTE — ED Notes (Signed)
Patient transported to X-ray 

## 2014-12-23 NOTE — ED Notes (Signed)
Patient returned from X-ray 

## 2015-12-26 ENCOUNTER — Encounter: Payer: Self-pay | Admitting: Gastroenterology

## 2016-02-21 ENCOUNTER — Encounter: Payer: Self-pay | Admitting: Gastroenterology

## 2016-03-05 ENCOUNTER — Ambulatory Visit (AMBULATORY_SURGERY_CENTER): Payer: Self-pay

## 2016-03-05 VITALS — Ht 64.0 in | Wt 152.4 lb

## 2016-03-05 DIAGNOSIS — Z1211 Encounter for screening for malignant neoplasm of colon: Secondary | ICD-10-CM

## 2016-03-05 MED ORDER — SUPREP BOWEL PREP KIT 17.5-3.13-1.6 GM/177ML PO SOLN: 1.0000 | Freq: Once | ORAL | Status: DC

## 2016-03-05 NOTE — Progress Notes (Signed)
No allergies to eggs or soy No diet meds No home oxygen No past problems with anesthesia  Has email and internet; declined emmi 

## 2016-03-09 ENCOUNTER — Encounter: Payer: Self-pay | Admitting: Gastroenterology

## 2016-03-18 ENCOUNTER — Ambulatory Visit (AMBULATORY_SURGERY_CENTER): Payer: BC Managed Care – PPO | Admitting: Gastroenterology

## 2016-03-18 ENCOUNTER — Encounter: Payer: Self-pay | Admitting: Gastroenterology

## 2016-03-18 VITALS — BP 123/75 | HR 86 | Temp 97.5°F | Resp 12 | Ht 64.0 in | Wt 152.0 lb

## 2016-03-18 DIAGNOSIS — D120 Benign neoplasm of cecum: Secondary | ICD-10-CM

## 2016-03-18 DIAGNOSIS — Z1211 Encounter for screening for malignant neoplasm of colon: Secondary | ICD-10-CM

## 2016-03-18 DIAGNOSIS — D126 Benign neoplasm of colon, unspecified: Secondary | ICD-10-CM

## 2016-03-18 DIAGNOSIS — D122 Benign neoplasm of ascending colon: Secondary | ICD-10-CM

## 2016-03-18 DIAGNOSIS — D123 Benign neoplasm of transverse colon: Secondary | ICD-10-CM

## 2016-03-18 MED ORDER — SODIUM CHLORIDE 0.9 % IV SOLN: 500.0000 mL | INTRAVENOUS | Status: DC

## 2016-03-18 NOTE — Patient Instructions (Signed)
Discharge instructions given. Handouts on polyps and hemorrhoids. Resume previous medications. YOU HAD AN ENDOSCOPIC PROCEDURE TODAY AT THE [facility]:   Refer to the procedure report that was given to you for any specific questions about what was found during the examination.  If the procedure report does not answer your questions, please call your gastroenterologist to clarify.  If you requested that your care partner not be given the details of your procedure findings, then the procedure report has been included in a sealed envelope for you to review at your convenience later.  YOU SHOULD EXPECT: Some feelings of bloating in the abdomen. Passage of more gas than usual.  Walking can help get rid of the air that was put into your GI tract during the procedure and reduce the bloating. If you had a lower endoscopy (such as a colonoscopy or flexible sigmoidoscopy) you may notice spotting of blood in your stool or on the toilet paper. If you underwent a bowel prep for your procedure, you may not have a normal bowel movement for a few days.  Please Note:  You might notice some irritation and congestion in your nose or some drainage.  This is from the oxygen used during your procedure.  There is no need for concern and it should clear up in a day or so.  SYMPTOMS TO REPORT IMMEDIATELY:   Following lower endoscopy (colonoscopy or flexible sigmoidoscopy):  Excessive amounts of blood in the stool  Significant tenderness or worsening of abdominal pains  Swelling of the abdomen that is new, acute  Fever of 100F or higher   For urgent or emergent issues, a gastroenterologist can be reached at any hour by calling [phone].   DIET: Your first meal following the procedure should be a small meal and then it is ok to progress to your normal diet. Heavy or fried foods are harder to digest and may make you feel nauseous or bloated.  Likewise, meals heavy in dairy and vegetables can increase  bloating.  Drink plenty of fluids but you should avoid alcoholic beverages for 24 hours.  ACTIVITY:  You should plan to take it easy for the rest of today and you should NOT DRIVE or use heavy machinery until tomorrow (because of the sedation medicines used during the test).    FOLLOW UP: Our staff will call the number listed on your records the next business day following your procedure to check on you and address any questions or concerns that you may have regarding the information given to you following your procedure. If we do not reach you, we will leave a message.  However, if you are feeling well and you are not experiencing any problems, there is no need to return our call.  We will assume that you have returned to your regular daily activities without incident.  If any biopsies were taken you will be contacted by phone or by letter within the next 1-3 weeks.  Please call us at [phone] if you have not heard about the biopsies in 3 weeks.    SIGNATURES/CONFIDENTIALITY: You and/or your care partner have signed paperwork which will be entered into your electronic medical record.  These signatures attest to the fact that that the information above on your After Visit Summary has been reviewed and is understood.  Full responsibility of the confidentiality of this discharge information lies with you and/or your care-partner. 

## 2016-03-18 NOTE — Progress Notes (Signed)
Called to room to assist during endoscopic procedure.  Patient ID and intended procedure confirmed with present staff. Received instructions for my participation in the procedure from the performing physician.  

## 2016-03-18 NOTE — Op Note (Signed)
[facility] Patient Name: Rebecca Galloway Procedure Date: 03/18/2016 3:09 PM MRN: [id number] Endoscopist: [place]. Loletha Carrow , MD Age: 70 Referring MD:  Date of Birth: 1946/06/06 Gender: Female Account #: [id number] Procedure:                Colonoscopy Indications:              Screening for colorectal malignant neoplasm Medicines:                Monitored Anesthesia Care Procedure:                Pre-Anesthesia Assessment:                           - Prior to the procedure, a History and Physical                            was performed, and patient medications and                            allergies were reviewed. The patient's tolerance of                            previous anesthesia was also reviewed. The risks                            and benefits of the procedure and the sedation                            options and risks were discussed with the patient.                            All questions were answered, and informed consent                            was obtained. Prior Anticoagulants: The patient has                            taken no previous anticoagulant or antiplatelet                            agents. ASA Grade Assessment: II - A patient with                            mild systemic disease. After reviewing the risks                            and benefits, the patient was deemed in                            satisfactory condition to undergo the procedure.                           After obtaining informed consent, the colonoscope  was passed under direct vision. Throughout the                            procedure, the patient's blood pressure, pulse, and                            oxygen saturations were monitored continuously. The                            Model CF-HQ190L [id number]) scope was introduced                            through the anus and advanced to the the cecum,                            identified by  appendiceal orifice and ileocecal                            valve. The colonoscopy was performed without                            difficulty. The patient tolerated the procedure                            well. The quality of the bowel preparation was                            excellent. The ileocecal valve, appendiceal                            orifice, and rectum were photographed. The bowel                            preparation used was SUPREP. Scope In: 3:14:41 PM Scope Out: 3:31:32 PM Scope Withdrawal Time: 0 hours 12 minutes 3 seconds  Total Procedure Duration: 0 hours 16 minutes 51 seconds  Findings:                 The perianal and digital rectal examinations were                            normal.                           Three sessile polyps were found in the hepatic                            flexure, proximal ascending colon and ileocecal                            valve. The polyps were 4 mm in size. These polyps                            were removed with a piecemeal technique using a  cold biopsy forceps. Resection and retrieval were                            complete.                           Internal hemorrhoids were found during                            retroflexion. The hemorrhoids were Grade I                            (internal hemorrhoids that do not prolapse).                           The exam was otherwise without abnormality. Complications:            No immediate complications. Estimated Blood Loss:     Estimated blood loss: none. Impression:               - Three 4 mm polyps at the hepatic flexure, in the                            proximal ascending colon and at the ileocecal                            valve, removed piecemeal using a cold biopsy                            forceps. Resected and retrieved.                           - Internal hemorrhoids.                           - The examination was otherwise  normal. Recommendation:           - Patient has a contact number available for                            emergencies. The signs and symptoms of potential                            delayed complications were discussed with the                            patient. Return to normal activities tomorrow.                            Written discharge instructions were provided to the                            patient.                           - Resume previous diet.                           -  Continue present medications.                           - Await pathology results.                           - Repeat colonoscopy is recommended for                            surveillance. The colonoscopy date will be                            determined after pathology results from today's                            exam become available for review. [name] L. Loletha Carrow, MD 03/18/2016 3:37:35 PM This report has been signed electronically.

## 2016-03-18 NOTE — Progress Notes (Signed)
To recovery, report to McCoy, RN, VSS 

## 2016-03-19 ENCOUNTER — Telehealth: Payer: Self-pay

## 2016-03-19 NOTE — Telephone Encounter (Signed)
  Follow up Call-  Call back number 03/18/2016  Post procedure Call Back phone  # [phone]  Permission to leave phone message Yes     Patient questions:  Do you have a fever, pain , or abdominal swelling? No. Pain Score  0 *  Have you tolerated food without any problems? Yes.    Have you been able to return to your normal activities? Yes.    Do you have any questions about your discharge instructions: Diet   No. Medications  No. Follow up visit  No.  Do you have questions or concerns about your Care? No.  Actions: * If pain score is 4 or above: No action needed, pain <4.

## 2016-03-31 ENCOUNTER — Encounter: Payer: Self-pay | Admitting: Gastroenterology

## 2016-06-25 IMAGING — DX DG CHEST 2V
2 series · 2 of 2 positions shown · non-contrast
Comparison: 07/29/2004.

CLINICAL DATA: Body aches.  Weakness.  Productive cough.

EXAM:
CHEST  2 VIEW

[chest pa]
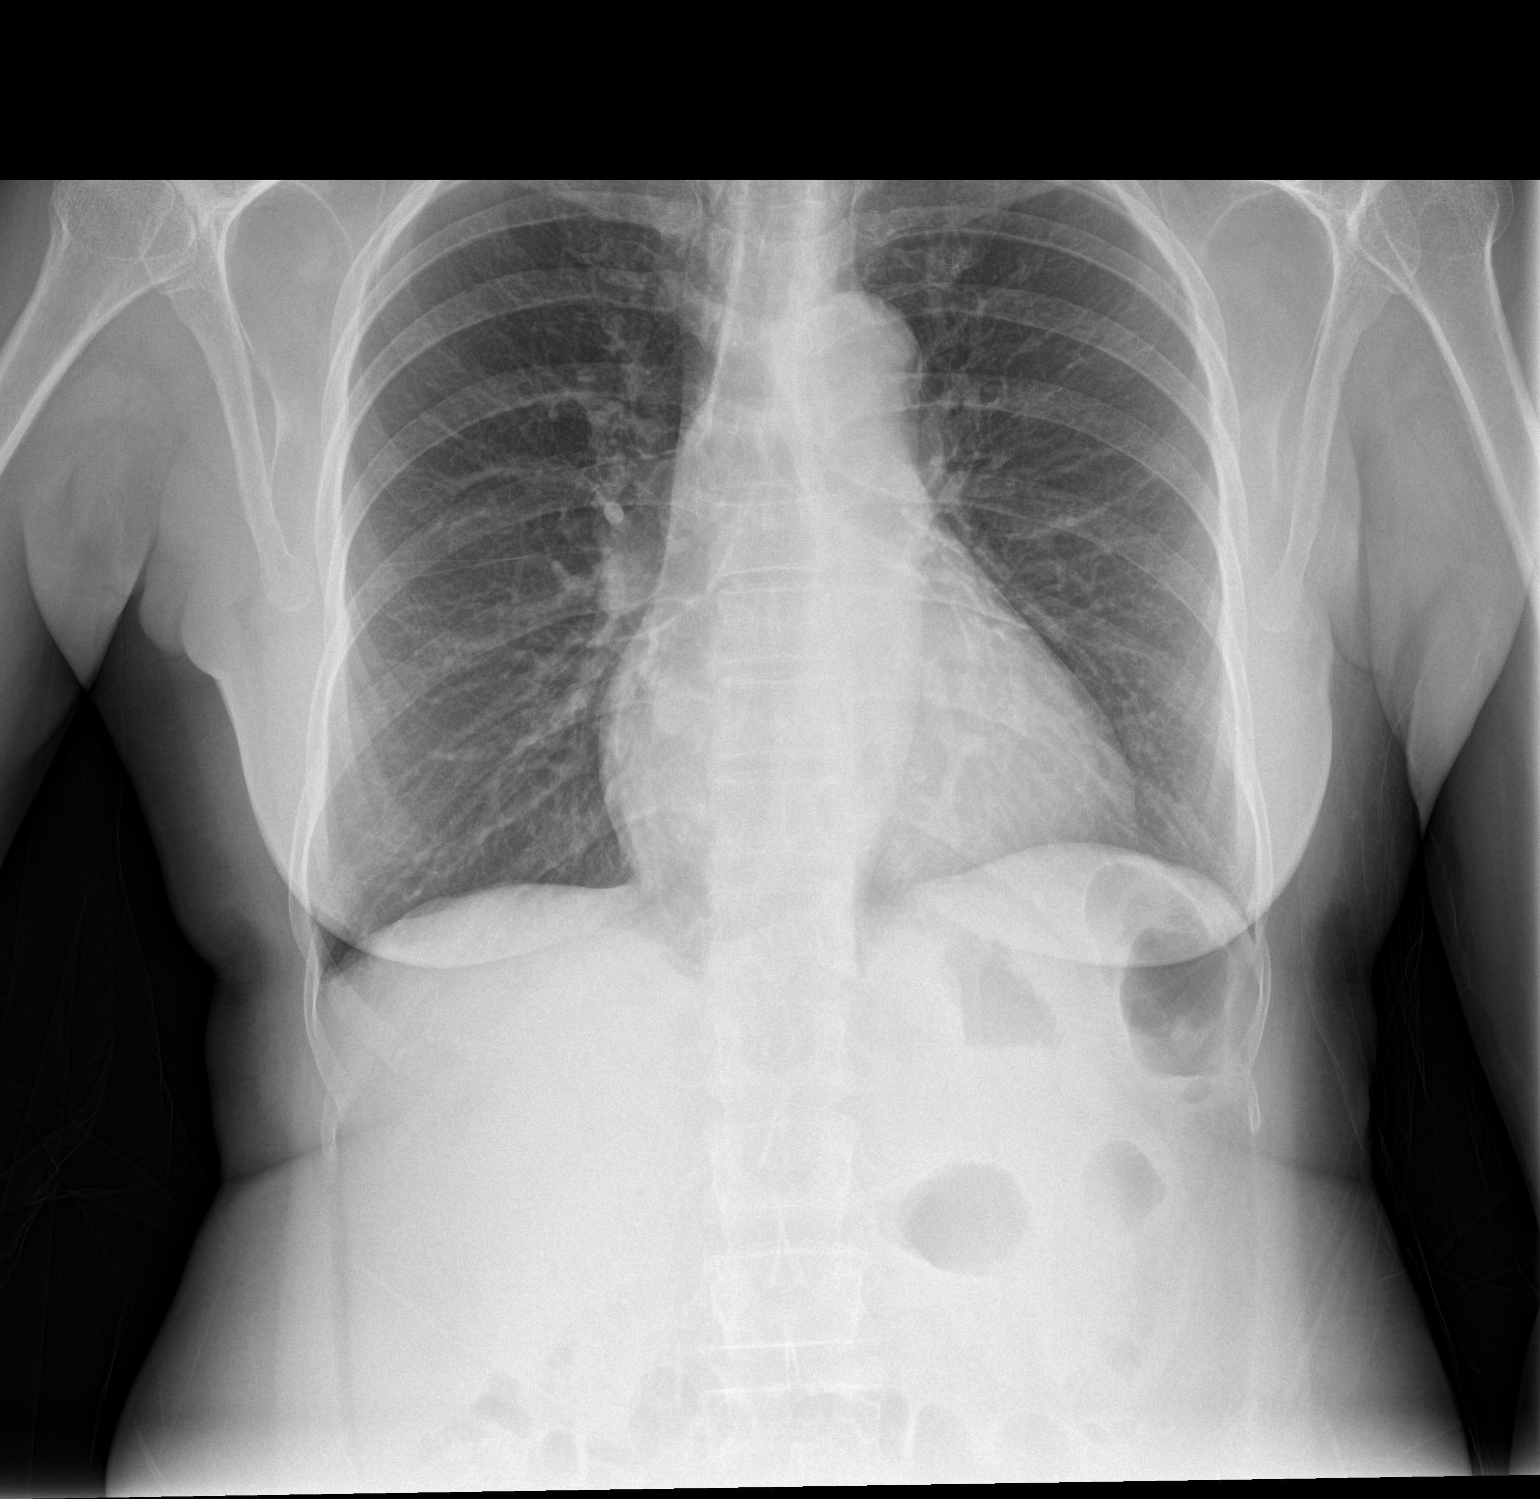

[chest lat]
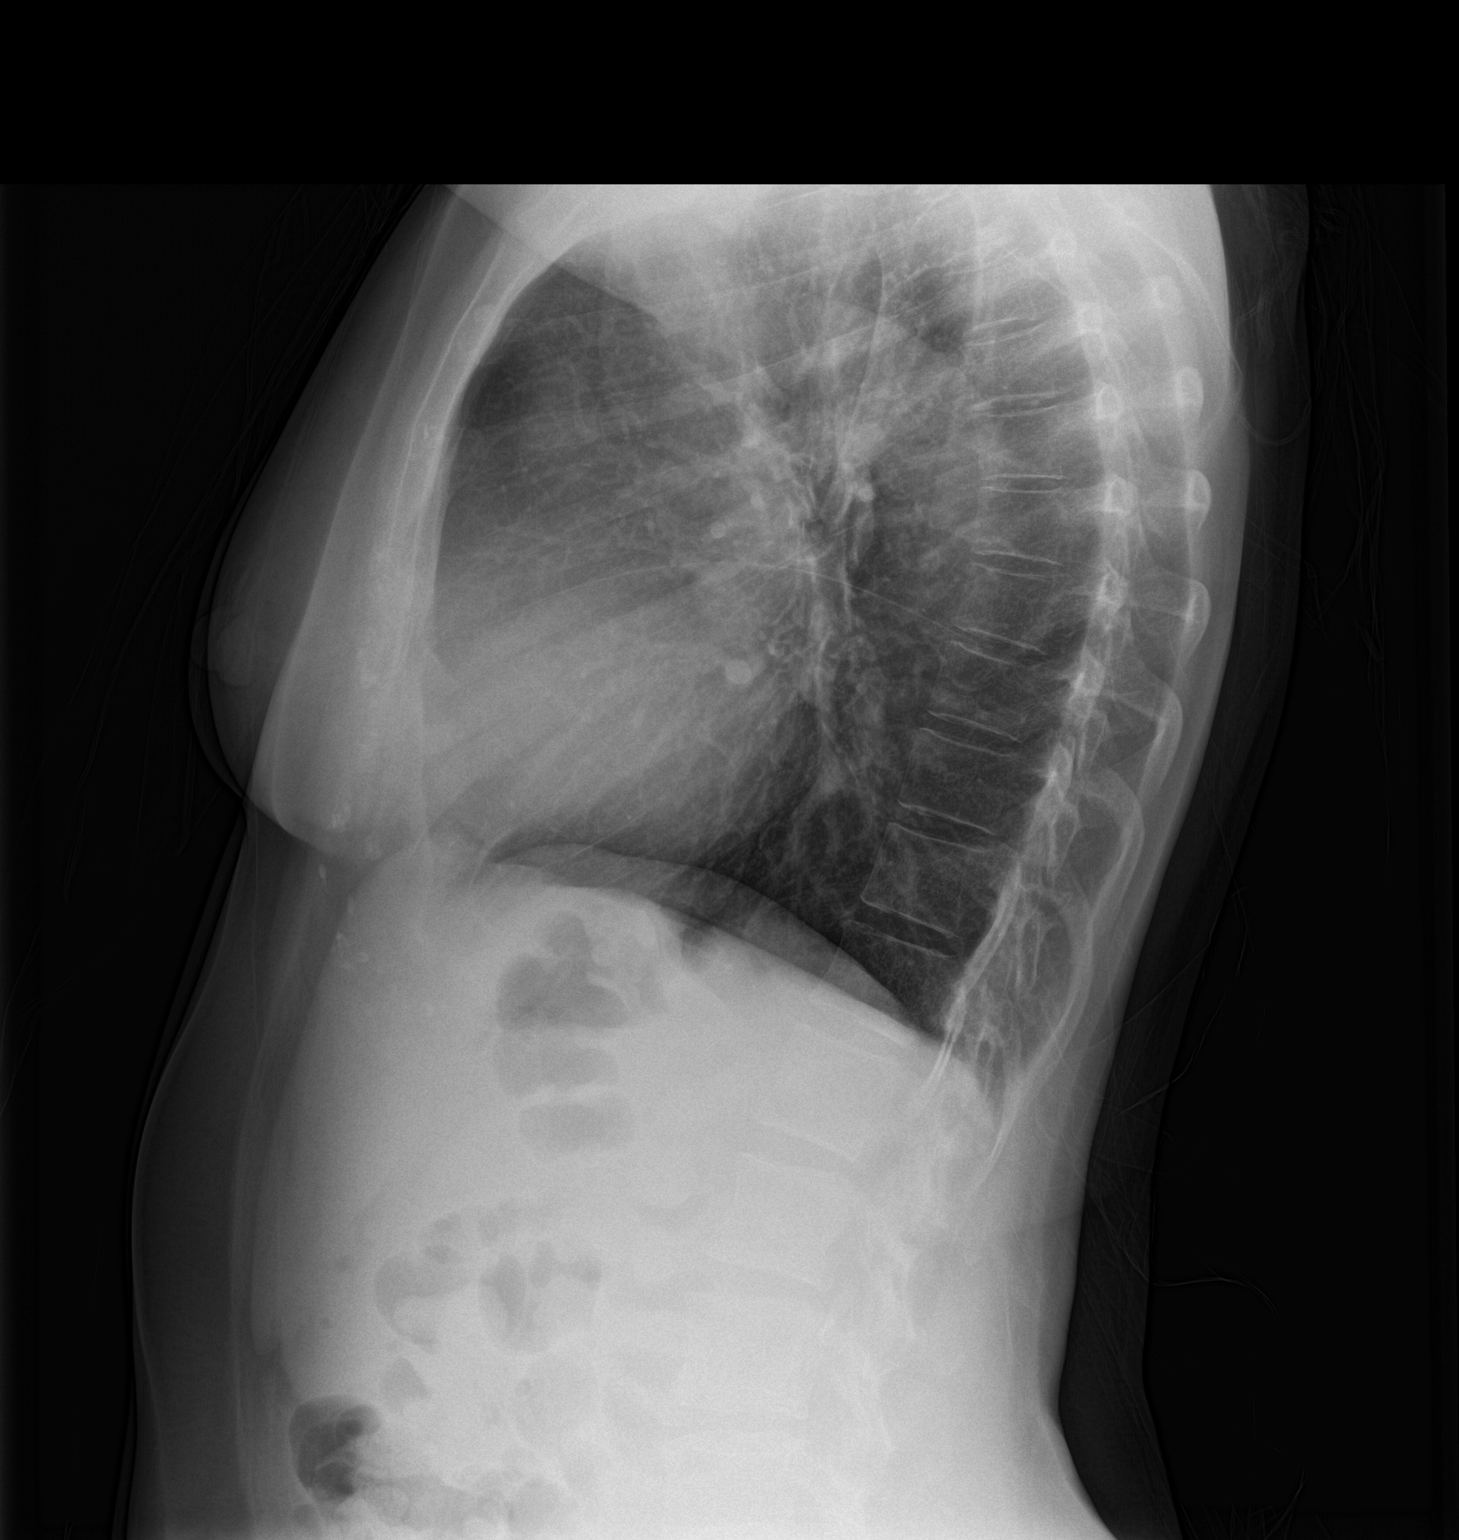

[2 of 2 positions shown; findings below may reference images not displayed]

FINDINGS: Cardiopericardial silhouette within normal limits. Mediastinal
contours normal. Trachea midline. No airspace disease or effusion.
IMPRESSION: No active cardiopulmonary disease.

## 2017-10-22 ENCOUNTER — Encounter: Payer: Self-pay | Admitting: Physical Therapy

## 2017-10-22 ENCOUNTER — Ambulatory Visit: Payer: BC Managed Care – PPO | Attending: Neurology | Admitting: Physical Therapy

## 2017-10-22 DIAGNOSIS — R2681 Unsteadiness on feet: Secondary | ICD-10-CM | POA: Diagnosis present

## 2017-10-22 DIAGNOSIS — R293 Abnormal posture: Secondary | ICD-10-CM | POA: Insufficient documentation

## 2017-10-22 DIAGNOSIS — M6281 Muscle weakness (generalized): Secondary | ICD-10-CM | POA: Insufficient documentation

## 2017-10-22 DIAGNOSIS — R2689 Other abnormalities of gait and mobility: Secondary | ICD-10-CM | POA: Insufficient documentation

## 2017-10-22 NOTE — Therapy (Signed)
[facility] [address] [place], [place], [address] Phone: [phone]   Fax:  [phone]  Physical Therapy Evaluation  Patient Details  Name: Rebecca Galloway MRN: [id number] Date of Birth: April 19, 1946 Referring Provider: Nicki Guadalajara   Encounter Date: 10/22/2017  PT End of Session - 10/22/17 1313    Visit Number  1    Number of Visits  13    Date for PT Re-Evaluation  12/21/17    Authorization Type  BCBS    PT Start Time  1103    PT Stop Time  1152    PT Time Calculation (min)  49 min    Equipment Utilized During Treatment  Gait belt    Activity Tolerance  Patient tolerated treatment well    Behavior During Therapy  [facility] for tasks assessed/performed       Past Medical History:  Diagnosis Date  . Hyperthyroidism    pt states is presently being worked up and was told she has hyperthyroidism, no meds  . Osteopenia   . Tendonitis     Past Surgical History:  Procedure Laterality Date  . CESAREAN SECTION  1986  . CESAREAN SECTION    . LAPAROSCOPY  03/12/2011   Procedure: LAPAROSCOPY OPERATIVE;  Surgeon: Melina Schools, MD;  Location: [facility];  Service: Gynecology;  Laterality: Left;  operative laparoscopy with left salping-oophorectomy    There were no vitals filed for this visit.   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  Went to ED in [place], where she was in hospital for a week, with nothing showing up.  Patient herself was questioning Guillan-Barre, but nothing showed this.  Feel like she was losing muscle strength, though nothing was showing.  Upon seeing Dr. Macario Carls, she did try Prednisone and IVIG (2 doses), and noted initial improvement.  No falls, but knees feel like they may buckle.    Pertinent  History  OA bilateral knees    Patient Stated Goals  Pt's goal for physical therapy are to help with strengthening and manage symptoms of weakness.    Currently in Pain?  No/denies         [facility] PT Assessment - 10/22/17 1117      Assessment   Medical Diagnosis  unsteady gait    Referring Provider  Nicki Guadalajara    Onset Date/Surgical Date  -- Fall 2018      Precautions   Precautions  Fall      Balance Screen   Has the patient fallen in the past 6 months  No    Has the patient had a decrease in activity level because of a fear of falling?   No More limited due to fatigue    Is the patient reluctant to leave their home because of a fear of falling?   No      Home Film/video editor residence    Living Arrangements  Children Daughter    Available Help at Discharge  Family    Type of [place] to enter    Entrance Stairs-Number of Steps  6    Entrance Stairs-Rails  Right    Home Layout  Two level;Bed/bath upstairs    Home Equipment  Walker - 2 wheels Has not used in about 2 months      Prior Function   Level of Independence  Independent    Vocation  Full time employment    Biomedical scientist  Works as PT at [employer], yardwork, which has been limited      Observation/Other Assessments   Focus on Therapeutic Outcomes (FOTO)   NA      Sensation   Light Touch  Appears Intact      ROM / Strength   AROM / PROM / Strength  Strength      Strength   Overall Strength  Deficits    Strength Assessment Site  Hip;Knee;Ankle    Right/Left Hip  Right;Left    Right Hip Flexion  4+/5    Right Hip ADduction  3/5 Tries to compensate with hip flexion    Left Hip Flexion  4/5    Left Hip ABduction  3-/5    Right/Left Knee  Right;Left    Right Knee Flexion  4/5    Right Knee Extension  5/5    Left Knee Flexion  3+/5    Left Knee Extension  4/5    Right/Left Ankle  Right;Left    Right Ankle  Dorsiflexion  3+/5    Right Ankle Inversion  4/5    Right Ankle Eversion  3+/5    Left Ankle Dorsiflexion  3+/5    Left Ankle Inversion  4/5    Left Ankle Eversion  3+/5      Transfers   Transfers  Sit to Stand;Stand to Sit    Sit to Stand  5: Supervision;Without upper extremity assist;From chair/3-in-1 Typically using hands, but did not previously    Five time sit to stand comments   18.91    Stand to Sit  5: Supervision;Without upper extremity assist;To chair/3-in-1;Uncontrolled descent      Ambulation/Gait   Ambulation/Gait  Yes    Ambulation/Gait Assistance  4: Min guard;5: Supervision    Ambulation/Gait Assistance Details  Increased lateral trunk lean as pt continues to ambulate.  She reaches out for furniture, for walls for stability    Ambulation Distance (Feet)  200 Feet    Assistive device  None    Gait Pattern  Step-through pattern;Decreased dorsiflexion - right;Decreased dorsiflexion - left;Trendelenburg;Lateral trunk lean to right;Lateral trunk lean to left;Poor foot clearance - left;Poor foot clearance - right;Narrow base of support Near-scissoring, foot slap, inversion bilaterally    Ambulation Surface  Level;Indoor    Gait velocity  9.75 sec = 3.36 ft/sec      Standardized Balance Assessment   Standardized Balance Assessment  Timed Up and Go Test;Dynamic Gait Index      Dynamic Gait Index   Level Surface  Mild Impairment    Change in Gait Speed  Mild Impairment    Gait with Horizontal Head Turns  Moderate Impairment    Gait with Vertical Head Turns  Moderate Impairment    Gait and Pivot Turn  Mild Impairment    Step Over Obstacle  Moderate Impairment    Step Around Obstacles  Moderate Impairment    Steps  Moderate Impairment    Total Score  11    DGI comment:  Scores <19/24 indicate increased fall risk.      Timed Up and Go Test   Normal TUG (seconds)  13.55    TUG Comments  Scores >13.5 seconds indicate increased fall risk.  Objective  measurements completed on examination: See above findings.                PT Short Term Goals - 10/22/17 1327      PT SHORT TERM GOAL #1   Title  Pt will be independent with HEP for improved strength, balance, gait.  TARGET 11/19/17    Time  4    Period  Weeks    Status  New    Target Date  11/19/17      PT SHORT TERM GOAL #2   Title  Pt will improve 5x sit<>stand to less than or equal to 15 seconds, to demonstrate improved functional strength for improved trasnfers.    Time  4    Period  Weeks    Status  New    Target Date  11/19/17      PT SHORT TERM GOAL #3   Title  Pt will improve Dynamic Gait Index score to at least 15/24 for decreased fall risk.    Time  4    Period  Weeks    Status  New    Target Date  11/19/17      PT SHORT TERM GOAL #4   Title  Pt will improve TUG score to less than or equal to 13 seconds for decreased fall risk.    Time  4    Period  Weeks    Target Date  11/19/17      PT SHORT TERM GOAL #5   Title  Pt will ambulate at least 500 ft, with appropriate assistive device, modified independently, for improved safety with community gait.    Time  4    Period  Weeks    Status  New    Target Date  11/19/17        PT Long Term Goals - 10/22/17 1330      PT LONG TERM GOAL #1   Title  Pt will verbalize understanding of fall prevention in home/work environment.  TARGET 12/03/17    Time  6    Period  Weeks    Status  New      PT LONG TERM GOAL #2   Title  Pt will improve 5x sit<>stand test to less than or equal to 12.6 seconds for improved functional strength for improved transfers.    Time  6    Period  Weeks    Status  New    Target Date  12/03/17      PT LONG TERM GOAL #3   Title  Pt will improve DGI score to at least 19/24 for decreased fall risk.    Time  6    Period  Weeks    Status  New    Target Date  12/03/17      PT LONG TERM GOAL #4   Title  Pt will negotiate at least 4 steps with rails, step-through pattern, for  improved independence with stair negotiation.    Time  6    Period  Weeks    Status  New    Target Date  12/03/17             Plan - 10/22/17 1314    Clinical Impression Statement  Pt is a 72 year old female who presents to OP PT with history of lower extremity numbness and weakness since the fall of 2018, several weeks after receiving flu shot.  Patient is unsure of actual diagnosis, though pt's dx in chart  is transverse myelitis.  Pt presents with decreased lower extremity strength, decreased trunk strength, decreased balance, decreased safety and independence with gait, abnormal posture, decreased endurance.  Pt is at fall risk per DGI and TUG score, with decreased funcitonal strength noted with 5x sit<>stand.  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  Pt would benefit from skilled PT to address the above stated deficits to decrease fall risk and improve functional mobility, safety and independence.    History and Personal Factors relevant to plan of care:  PMH includes OA in knees, thyroid disease, pre-diabetes; at fall risk per DGI, TUG scores; >3 systems involved    Clinical Presentation  Evolving    Clinical Presentation due to:  previously independent prior to illness, fall risk, fluctuations in mobility level    Clinical Decision Making  Moderate    Rehab Potential  Good    PT Frequency  2x / week    PT Duration  6 weeks plus eval    PT Treatment/Interventions  ADLs/Self Care Home Management;DME Instruction;Balance training;Therapeutic exercise;Therapeutic activities;Functional mobility training;Stair training;Gait training;Neuromuscular re-education;Patient/family education;Orthotic Fit/Training    PT Next Visit Plan  Initiate HEP to address hip and ankle weakness; trunk strengthening; gait training with appropriate assistive device for improved stability; may  need to assess SLS and tandem stance    Consulted and Agree with Plan of Care  Patient       Patient will benefit from skilled therapeutic intervention in order to improve the following deficits and impairments:  Abnormal gait, Decreased balance, Decreased endurance, Decreased mobility, Decreased safety awareness, Difficulty walking, Decreased strength, Postural dysfunction  Visit Diagnosis: Other abnormalities of gait and mobility  Muscle weakness (generalized)  Unsteadiness on feet  Abnormal posture     Problem List Patient Active Problem List   Diagnosis Date Noted  . Wears glasses 02/17/2011  . Arthritis 02/17/2011  . Menopause 02/17/2011    MARRIOTT,AMY W. 10/22/2017, 1:33 PM  Frazier Butt., PT   [facility] [address] [place], [place], [address] Phone: [phone]   Fax:  [phone]  Name: Rebecca Galloway MRN: [id number] Date of Birth: 06-Mar-1946

## 2017-10-25 ENCOUNTER — Encounter: Payer: Self-pay | Admitting: Physical Therapy

## 2017-10-25 ENCOUNTER — Ambulatory Visit: Payer: BC Managed Care – PPO | Admitting: Physical Therapy

## 2017-10-25 DIAGNOSIS — R2689 Other abnormalities of gait and mobility: Secondary | ICD-10-CM | POA: Diagnosis not present

## 2017-10-25 DIAGNOSIS — R2681 Unsteadiness on feet: Secondary | ICD-10-CM

## 2017-10-25 DIAGNOSIS — M6281 Muscle weakness (generalized): Secondary | ICD-10-CM

## 2017-10-25 DIAGNOSIS — R293 Abnormal posture: Secondary | ICD-10-CM

## 2017-10-25 NOTE — Therapy (Signed)
[facility] [address] [place], [place], [address] Phone: [phone]   Fax:  [phone]  Physical Therapy Treatment  Patient Details  Name: Rebecca Galloway MRN: [id number] Date of Birth: 10-Mar-1946 Referring Provider: Nicki Guadalajara   Encounter Date: 10/25/2017  PT End of Session - 10/25/17 0920    Visit Number  2    Number of Visits  13    Date for PT Re-Evaluation  12/21/17    Authorization Type  BCBS    PT Start Time  0845    PT Stop Time  0925    PT Time Calculation (min)  40 min    Activity Tolerance  Patient tolerated treatment well    Behavior During Therapy  [facility] for tasks assessed/performed       Past Medical History:  Diagnosis Date  . Hyperthyroidism    pt states is presently being worked up and was told she has hyperthyroidism, no meds  . Osteopenia   . Tendonitis     Past Surgical History:  Procedure Laterality Date  . CESAREAN SECTION  1986  . CESAREAN SECTION    . LAPAROSCOPY  03/12/2011   Procedure: LAPAROSCOPY OPERATIVE;  Surgeon: Melina Schools, MD;  Location: [facility];  Service: Gynecology;  Laterality: Left;  operative laparoscopy with left salping-oophorectomy    There were no vitals filed for this visit.  Subjective Assessment - 10/25/17 0848    Subjective  having up and down days; yesterday "wasn't too bad."  still convinced she has Gullian-Barre    Pertinent History  OA bilateral knees, pre-diabetes, thyroid disease; Dr. Hassell Halim note says transvers myelitis?    Patient Stated Goals  Pt's goal for physical therapy are to help with strengthening and manage symptoms of weakness.    Currently in Pain?  No/denies                      [facility] Adult PT Treatment/Exercise - 10/25/17 0347      Exercises   Exercises  Knee/Hip;Ankle      Knee/Hip Exercises: Aerobic   Nustep  L3 x 5 min; 4 extremities      Knee/Hip Exercises: Seated   Sit to Sand  15 reps;without  UE support from mat level with blue foam      Knee/Hip Exercises: Supine   Bridges  15 reps    Straight Leg Raises  Both;15 reps    Straight Leg Raises Limitations  started with 2#; had to remove after ~ 5 reps due to fatigue and difficulty      Knee/Hip Exercises: Sidelying   Clams  x15 bil; green theraband      Ankle Exercises: Seated   Other Seated Ankle Exercises  seated ankle 4-way with green theraband x 15 reps (without inversion)             PT Education - 10/25/17 0920    Education provided  Yes    Education Details  HEP, green theraband    Person(s) Educated  Patient    Methods  Explanation;Demonstration;Handout    Comprehension  Verbalized understanding;Returned demonstration;Need further instruction       PT Short Term Goals - 10/22/17 1327      PT SHORT TERM GOAL #1   Title  Pt will be independent with HEP for improved strength, balance, gait.  TARGET 11/19/17    Time  4    Period  Weeks    Status  New  Target Date  11/19/17      PT SHORT TERM GOAL #2   Title  Pt will improve 5x sit<>stand to less than or equal to 15 seconds, to demonstrate improved functional strength for improved trasnfers.    Time  4    Period  Weeks    Status  New    Target Date  11/19/17      PT SHORT TERM GOAL #3   Title  Pt will improve Dynamic Gait Index score to at least 15/24 for decreased fall risk.    Time  4    Period  Weeks    Status  New    Target Date  11/19/17      PT SHORT TERM GOAL #4   Title  Pt will improve TUG score to less than or equal to 13 seconds for decreased fall risk.    Time  4    Period  Weeks    Target Date  11/19/17      PT SHORT TERM GOAL #5   Title  Pt will ambulate at least 500 ft, with appropriate assistive device, modified independently, for improved safety with community gait.    Time  4    Period  Weeks    Status  New    Target Date  11/19/17        PT Long Term Goals - 10/22/17 1330      PT LONG TERM GOAL #1   Title  Pt will  verbalize understanding of fall prevention in home/work environment.  TARGET 12/03/17    Time  6    Period  Weeks    Status  New      PT LONG TERM GOAL #2   Title  Pt will improve 5x sit<>stand test to less than or equal to 12.6 seconds for improved functional strength for improved transfers.    Time  6    Period  Weeks    Status  New    Target Date  12/03/17      PT LONG TERM GOAL #3   Title  Pt will improve DGI score to at least 19/24 for decreased fall risk.    Time  6    Period  Weeks    Status  New    Target Date  12/03/17      PT LONG TERM GOAL #4   Title  Pt will negotiate at least 4 steps with rails, step-through pattern, for improved independence with stair negotiation.    Time  6    Period  Weeks    Status  New    Target Date  12/03/17            Plan - 10/25/17 2130    Clinical Impression Statement  Session today focused on establishing HEP to address LE weakness, which pt needs min cues to decrease substitution with exercises.  Will continue to benefit from PT to maximize function.    PT Next Visit Plan  review HEP;  gait training with appropriate assistive device for improved stability; may need to assess SLS and tandem stance    Consulted and Agree with Plan of Care  Patient       Patient will benefit from skilled therapeutic intervention in order to improve the following deficits and impairments:  Abnormal gait, Decreased balance, Decreased endurance, Decreased mobility, Decreased safety awareness, Difficulty walking, Decreased strength, Postural dysfunction  Visit Diagnosis: Other abnormalities of gait and mobility  Muscle weakness (generalized)  Unsteadiness on feet  Abnormal posture     Problem List Patient Active Problem List   Diagnosis Date Noted  . Wears glasses 02/17/2011  . Arthritis 02/17/2011  . Menopause 02/17/2011      Laureen Abrahams, PT, DPT 10/25/17 9:22 AM    [facility] [address] [place], [place], [address] Phone: [phone]   Fax:  [phone]  Name: Rebecca Galloway MRN: [id number] Date of Birth: [birth year]

## 2017-10-25 NOTE — Patient Instructions (Signed)
Bridging    Slowly raise buttocks from floor, keeping stomach tight.  Hold for 5 seconds. Repeat __15__ times per set. Do __1__ sets per session. Do __1-2__ sessions per day.   Straight Leg Raise    Tighten stomach and slowly raise locked right leg __6-8__ inches from floor.  Repeat with other leg.  Use up to 2# ankle weights. Repeat _15__ times per set. Do __1__ sets per session. Do __1-2__ sessions per day.   Abduction: Clam (Eccentric) - Side-Lying    Lie on side with knees bent, and green band around thighs. Lift top knee, keeping feet together. Keep trunk steady. Hold for 2-3 seconds, then slowly lower. _15__ reps per set, _1-2__ sets per day.   Functional Quadriceps: Sit to Stand    Sit on edge of chair, feet flat on floor. Stand upright, extending knees fully. Repeat __15__ times per set. Do __1__ sets per session. Do _1-2___ sessions per day.  ANKLE: Dorsiflexion (Band)    Sit at edge of surface. Place band around top of foot. Keeping heel on floor, raise toes of banded foot. Hold _1-2__ seconds. Use ___green_____ band. _15__ reps per set, _1-2__ sets per day.  Repeat with other leg.  Copyright  VHI. All rights reserved.    ANKLE: Eversion, Unilateral (Band)    Place band around left foot. Keeping heel in place, raise toes of banded foot up and away from body. Do not move hip. Hold __1-2_ seconds. Use __green___ band. _15__ reps per set, _1-2__ sets per day.  Repeat with other leg.    ANKLE: Plantarflexion - Sitting (Band)    Place band around foot; hold other end. Sit at edge of sitting surface. Keep heel in place. Push foot down against band. Hold _1-2__ seconds. Use ___green_____ band. __15_ reps per set, _1-2__ sets per day.  Repeat with other leg.  Copyright  VHI. All rights reserved.

## 2017-10-27 ENCOUNTER — Encounter: Payer: Self-pay | Admitting: Physical Therapy

## 2017-10-27 ENCOUNTER — Ambulatory Visit: Payer: BC Managed Care – PPO | Admitting: Physical Therapy

## 2017-10-27 DIAGNOSIS — R2681 Unsteadiness on feet: Secondary | ICD-10-CM

## 2017-10-27 DIAGNOSIS — R2689 Other abnormalities of gait and mobility: Secondary | ICD-10-CM

## 2017-10-27 DIAGNOSIS — M6281 Muscle weakness (generalized): Secondary | ICD-10-CM

## 2017-10-27 DIAGNOSIS — R293 Abnormal posture: Secondary | ICD-10-CM

## 2017-10-27 NOTE — Therapy (Signed)
[facility] [address] [place], [place], [address] Phone: [phone]   Fax:  [phone]  Physical Therapy Treatment  Patient Details  Name: Rebecca Galloway MRN: [id number] Date of Birth: May 02, 1946 Referring Provider: Nicki Guadalajara   Encounter Date: 10/27/2017  PT End of Session - 10/27/17 0922    Visit Number  3    Number of Visits  13    Date for PT Re-Evaluation  12/21/17    Authorization Type  BCBS    PT Start Time  0843    PT Stop Time  0922    PT Time Calculation (min)  39 min    Activity Tolerance  Patient tolerated treatment well    Behavior During Therapy  [facility] for tasks assessed/performed       Past Medical History:  Diagnosis Date  . Hyperthyroidism    pt states is presently being worked up and was told she has hyperthyroidism, no meds  . Osteopenia   . Tendonitis     Past Surgical History:  Procedure Laterality Date  . CESAREAN SECTION  1986  . CESAREAN SECTION    . LAPAROSCOPY  03/12/2011   Procedure: LAPAROSCOPY OPERATIVE;  Surgeon: Melina Schools, MD;  Location: [facility];  Service: Gynecology;  Laterality: Left;  operative laparoscopy with left salping-oophorectomy    There were no vitals filed for this visit.  Subjective Assessment - 10/27/17 0843    Subjective  had some Lt knee pain yesterday; and bil knees felt very weak yesterday.    Pertinent History  OA bilateral knees, pre-diabetes, thyroid disease; Dr. Hassell Halim note says transvers myelitis?    Patient Stated Goals  Pt's goal for physical therapy are to help with strengthening and manage symptoms of weakness.    Currently in Pain?  No/denies                      [facility] Adult PT Treatment/Exercise - 10/27/17 0848      Knee/Hip Exercises: Standing   Other Standing Knee Exercises  tall kneeling: mini squats holding dowel with 2# x 15; bil shoulder flexion with 2# dowel x 15; trunk rotation with 2# dowel x 15 bil       Knee/Hip Exercises: Seated   Long Arc Quad  Both;15 reps seated on physioball with 1UE support    Marching  15 reps;Both    Marching Limitations  seated on physioball with 1UE support    Sit to Sand  15 reps;without UE support      Knee/Hip Exercises: Supine   Bridges  15 reps    Straight Leg Raises  Both;15 reps      Knee/Hip Exercises: Sidelying   Clams  x15 bil; green theraband      Ankle Exercises: Seated   Other Seated Ankle Exercises  seated ankle 4-way with green theraband x 15 reps (without inversion)               PT Short Term Goals - 10/22/17 1327      PT SHORT TERM GOAL #1   Title  Pt will be independent with HEP for improved strength, balance, gait.  TARGET 11/19/17    Time  4    Period  Weeks    Status  New    Target Date  11/19/17      PT SHORT TERM GOAL #2   Title  Pt will improve 5x sit<>stand to less than or equal to 15 seconds,  to demonstrate improved functional strength for improved trasnfers.    Time  4    Period  Weeks    Status  New    Target Date  11/19/17      PT SHORT TERM GOAL #3   Title  Pt will improve Dynamic Gait Index score to at least 15/24 for decreased fall risk.    Time  4    Period  Weeks    Status  New    Target Date  11/19/17      PT SHORT TERM GOAL #4   Title  Pt will improve TUG score to less than or equal to 13 seconds for decreased fall risk.    Time  4    Period  Weeks    Target Date  11/19/17      PT SHORT TERM GOAL #5   Title  Pt will ambulate at least 500 ft, with appropriate assistive device, modified independently, for improved safety with community gait.    Time  4    Period  Weeks    Status  New    Target Date  11/19/17        PT Long Term Goals - 10/22/17 1330      PT LONG TERM GOAL #1   Title  Pt will verbalize understanding of fall prevention in home/work environment.  TARGET 12/03/17    Time  6    Period  Weeks    Status  New      PT LONG TERM GOAL #2   Title  Pt will improve 5x  sit<>stand test to less than or equal to 12.6 seconds for improved functional strength for improved transfers.    Time  6    Period  Weeks    Status  New    Target Date  12/03/17      PT LONG TERM GOAL #3   Title  Pt will improve DGI score to at least 19/24 for decreased fall risk.    Time  6    Period  Weeks    Status  New    Target Date  12/03/17      PT LONG TERM GOAL #4   Title  Pt will negotiate at least 4 steps with rails, step-through pattern, for improved independence with stair negotiation.    Time  6    Period  Weeks    Status  New    Target Date  12/03/17            Plan - 10/27/17 7616    Clinical Impression Statement  Session focused on review of HEP as pt unable to perform since last visit; and core stability exercises.  Needs UE support intermittently with core exercises.  Will continue to benfeit from PT to maximize function.    PT Treatment/Interventions  ADLs/Self Care Home Management;DME Instruction;Balance training;Therapeutic exercise;Therapeutic activities;Functional mobility training;Stair training;Gait training;Neuromuscular re-education;Patient/family education;Orthotic Fit/Training    PT Next Visit Plan  gait training with appropriate assistive device for improved stability; may need to assess SLS and tandem stance, core stability    Consulted and Agree with Plan of Care  Patient       Patient will benefit from skilled therapeutic intervention in order to improve the following deficits and impairments:  Abnormal gait, Decreased balance, Decreased endurance, Decreased mobility, Decreased safety awareness, Difficulty walking, Decreased strength, Postural dysfunction  Visit Diagnosis: Other abnormalities of gait and mobility  Muscle weakness (generalized)  Unsteadiness on feet  Abnormal  posture     Problem List Patient Active Problem List   Diagnosis Date Noted  . Wears glasses 02/17/2011  . Arthritis 02/17/2011  . Menopause 02/17/2011       Laureen Abrahams, PT, DPT 10/27/17 9:24 AM    [facility] [address] [place], [place], [address] Phone: [phone]   Fax:  [phone]  Name: Rebecca Galloway MRN: [id number] Date of Birth: [birth year]

## 2017-11-01 ENCOUNTER — Ambulatory Visit: Payer: BC Managed Care – PPO | Attending: Neurology | Admitting: Physical Therapy

## 2017-11-01 ENCOUNTER — Encounter: Payer: Self-pay | Admitting: Physical Therapy

## 2017-11-01 DIAGNOSIS — R293 Abnormal posture: Secondary | ICD-10-CM

## 2017-11-01 DIAGNOSIS — R2689 Other abnormalities of gait and mobility: Secondary | ICD-10-CM | POA: Diagnosis present

## 2017-11-01 DIAGNOSIS — M6281 Muscle weakness (generalized): Secondary | ICD-10-CM

## 2017-11-01 DIAGNOSIS — R2681 Unsteadiness on feet: Secondary | ICD-10-CM

## 2017-11-01 NOTE — Patient Instructions (Signed)
Feet Together (Compliant Surface) Head Motion - Eyes Open    With eyes open, standing on compliant surface: __pillow or cusion___, feet together, move head slowly: up and down 10 times each way and side to side 10 times each way. Repeat __1-2__ times per session. Do __2-3__ sessions per day.  Copyright  VHI. All rights reserved.   Feet Apart (Compliant Surface) Varied Arm Positions - Eyes Closed    Stand on compliant surface: __pillow or cushion___ with feet shoulder width apart and arms at your side. Close eyes and visualize upright position. Hold__15-20__ seconds. Repeat __3-5__ times per session. Do __2-3__ sessions per day.  Copyright  VHI. All rights reserved.

## 2017-11-01 NOTE — Therapy (Signed)
[facility] [address] [place], [place], [address] Phone: [phone]   Fax:  [phone]  Physical Therapy Treatment  Patient Details  Name: Rebecca Galloway MRN: [id number] Date of Birth: 07/13/1946 Referring Provider: Nicki Guadalajara   Encounter Date: 11/01/2017  PT End of Session - 11/01/17 1518    Visit Number  4    Number of Visits  13    Date for PT Re-Evaluation  12/21/17    Authorization Type  BCBS    PT Start Time  1445    PT Stop Time  1525    PT Time Calculation (min)  40 min    Equipment Utilized During Treatment  Gait belt    Activity Tolerance  Patient tolerated treatment well    Behavior During Therapy  [facility] for tasks assessed/performed       Past Medical History:  Diagnosis Date  . Hyperthyroidism    pt states is presently being worked up and was told she has hyperthyroidism, no meds  . Osteopenia   . Tendonitis     Past Surgical History:  Procedure Laterality Date  . CESAREAN SECTION  1986  . CESAREAN SECTION    . LAPAROSCOPY  03/12/2011   Procedure: LAPAROSCOPY OPERATIVE;  Surgeon: Melina Schools, MD;  Location: [facility];  Service: Gynecology;  Laterality: Left;  operative laparoscopy with left salping-oophorectomy    There were no vitals filed for this visit.  Subjective Assessment - 11/01/17 1448    Subjective  knees and unsteadiness seem to be the most problematic.  yesterday had pain in Lt knee, but resolved    Patient Stated Goals  Pt's goal for physical therapy are to help with strengthening and manage symptoms of weakness.    Currently in Pain?  No/denies                      [facility] Adult PT Treatment/Exercise - 11/01/17 1448      Knee/Hip Exercises: Aerobic   Nustep  L3 x 6 min; LE only      Knee/Hip Exercises: Standing   Knee Flexion  Both;15 reps 2#    Hip Flexion  Both;15 reps;Knee bent 2#    Hip Abduction  Both;15 reps;Knee straight 2#    Hip Extension   Both;15 reps;Knee straight 2#          Balance Exercises - 11/01/17 1504      Balance Exercises: Standing   Standing Eyes Opened  Narrow base of support (BOS);Head turns;Foam/compliant surface    Standing Eyes Closed  Wide (BOA);Foam/compliant surface;3 reps;20 secs    Other Standing Exercises  modified SLS: ball roll with foot and 1UE support          PT Short Term Goals - 10/22/17 1327      PT SHORT TERM GOAL #1   Title  Pt will be independent with HEP for improved strength, balance, gait.  TARGET 11/19/17    Time  4    Period  Weeks    Status  New    Target Date  11/19/17      PT SHORT TERM GOAL #2   Title  Pt will improve 5x sit<>stand to less than or equal to 15 seconds, to demonstrate improved functional strength for improved trasnfers.    Time  4    Period  Weeks    Status  New    Target Date  11/19/17      PT  SHORT TERM GOAL #3   Title  Pt will improve Dynamic Gait Index score to at least 15/24 for decreased fall risk.    Time  4    Period  Weeks    Status  New    Target Date  11/19/17      PT SHORT TERM GOAL #4   Title  Pt will improve TUG score to less than or equal to 13 seconds for decreased fall risk.    Time  4    Period  Weeks    Target Date  11/19/17      PT SHORT TERM GOAL #5   Title  Pt will ambulate at least 500 ft, with appropriate assistive device, modified independently, for improved safety with community gait.    Time  4    Period  Weeks    Status  New    Target Date  11/19/17        PT Long Term Goals - 10/22/17 1330      PT LONG TERM GOAL #1   Title  Pt will verbalize understanding of fall prevention in home/work environment.  TARGET 12/03/17    Time  6    Period  Weeks    Status  New      PT LONG TERM GOAL #2   Title  Pt will improve 5x sit<>stand test to less than or equal to 12.6 seconds for improved functional strength for improved transfers.    Time  6    Period  Weeks    Status  New    Target Date  12/03/17      PT  LONG TERM GOAL #3   Title  Pt will improve DGI score to at least 19/24 for decreased fall risk.    Time  6    Period  Weeks    Status  New    Target Date  12/03/17      PT LONG TERM GOAL #4   Title  Pt will negotiate at least 4 steps with rails, step-through pattern, for improved independence with stair negotiation.    Time  6    Period  Weeks    Status  New    Target Date  12/03/17            Plan - 11/01/17 1519    Clinical Impression Statement  Pt tolerated session well today and added balance exercises to HEP as pt has significant difficulty with EC activities.  Will continue to benefit from PT to maximize function.    PT Treatment/Interventions  ADLs/Self Care Home Management;DME Instruction;Balance training;Therapeutic exercise;Therapeutic activities;Functional mobility training;Stair training;Gait training;Neuromuscular re-education;Patient/family education;Orthotic Fit/Training    PT Next Visit Plan  gait training PRN (recommend RW which pt has but won't use); may need to assess SLS and tandem stance, core stability; continue balance and LE strengthening    Consulted and Agree with Plan of Care  Patient       Patient will benefit from skilled therapeutic intervention in order to improve the following deficits and impairments:  Abnormal gait, Decreased balance, Decreased endurance, Decreased mobility, Decreased safety awareness, Difficulty walking, Decreased strength, Postural dysfunction  Visit Diagnosis: Other abnormalities of gait and mobility  Muscle weakness (generalized)  Unsteadiness on feet  Abnormal posture     Problem List Patient Active Problem List   Diagnosis Date Noted  . Wears glasses 02/17/2011  . Arthritis 02/17/2011  . Menopause 02/17/2011      Laureen Abrahams, PT, DPT  11/01/17 3:21 PM    [facility] [address] [place], [place], [address] Phone: [phone]   Fax:   [phone]  Name: DERRIKA RUFFALO MRN: [id number] Date of Birth: [birth year]

## 2017-11-03 ENCOUNTER — Ambulatory Visit: Payer: BC Managed Care – PPO | Admitting: Physical Therapy

## 2017-11-03 ENCOUNTER — Encounter: Payer: Self-pay | Admitting: Physical Therapy

## 2017-11-03 DIAGNOSIS — M6281 Muscle weakness (generalized): Secondary | ICD-10-CM

## 2017-11-03 DIAGNOSIS — R2689 Other abnormalities of gait and mobility: Secondary | ICD-10-CM

## 2017-11-03 DIAGNOSIS — R2681 Unsteadiness on feet: Secondary | ICD-10-CM

## 2017-11-03 DIAGNOSIS — R293 Abnormal posture: Secondary | ICD-10-CM

## 2017-11-03 NOTE — Therapy (Signed)
[facility] [address] [place], [place], [address] Phone: [phone]   Fax:  [phone]  Physical Therapy Treatment  Patient Details  Name: Rebecca Galloway MRN: [id number] Date of Birth: 03/30/1946 Referring Provider: Nicki Guadalajara   Encounter Date: 11/03/2017  PT End of Session - 11/03/17 1523    Visit Number  5    Number of Visits  13    Date for PT Re-Evaluation  12/21/17    Authorization Type  BCBS    PT Start Time  1445    PT Stop Time  1523    PT Time Calculation (min)  38 min    Activity Tolerance  Patient tolerated treatment well    Behavior During Therapy  [facility] for tasks assessed/performed       Past Medical History:  Diagnosis Date  . Hyperthyroidism    pt states is presently being worked up and was told she has hyperthyroidism, no meds  . Osteopenia   . Tendonitis     Past Surgical History:  Procedure Laterality Date  . CESAREAN SECTION  1986  . CESAREAN SECTION    . LAPAROSCOPY  03/12/2011   Procedure: LAPAROSCOPY OPERATIVE;  Surgeon: Melina Schools, MD;  Location: [facility];  Service: Gynecology;  Laterality: Left;  operative laparoscopy with left salping-oophorectomy    There were no vitals filed for this visit.  Subjective Assessment - 11/03/17 1447    Subjective  "not too bad today. feeling pretty good."    Pertinent History  OA bilateral knees, pre-diabetes, thyroid disease; Dr. Hassell Halim note says transvers myelitis?    Patient Stated Goals  Pt's goal for physical therapy are to help with strengthening and manage symptoms of weakness.    Currently in Pain?  No/denies c/o a little back pain "had accupuncture yesterday. but nothing bad"                      [facility] Adult PT Treatment/Exercise - 11/03/17 0001      Lumbar Exercises: Quadruped   Madcat/Old Horse  5 reps    Single Arm Raise  Right;Left;10 reps    Straight Leg Raise  10 reps alternating; bil    Opposite  Arm/Leg Raise  Right arm/Left leg;Left arm/Right leg;5 reps      Knee/Hip Exercises: Seated   Marching Limitations  on blue physioball: ant/post pelvic tilts x 10; LAQ x 10 bil alt; marching alt x 10 bil    Sit to Sand  10 reps;without UE support on airex      Knee/Hip Exercises: Supine   Bridges  15 reps on physioball    Other Supine Knee/Hip Exercises  bridging with hamstring curls on red physioball x 10 reps          Balance Exercises - 11/03/17 1515      Balance Exercises: Standing   Standing Eyes Opened  Narrow base of support (BOS);Head turns;Foam/compliant surface    Standing Eyes Closed  Foam/compliant surface;Narrow base of support (BOS);3 reps;10 secs;Wide (BOA);Head turns          PT Short Term Goals - 10/22/17 1327      PT SHORT TERM GOAL #1   Title  Pt will be independent with HEP for improved strength, balance, gait.  TARGET 11/19/17    Time  4    Period  Weeks    Status  New    Target Date  11/19/17      PT SHORT  TERM GOAL #2   Title  Pt will improve 5x sit<>stand to less than or equal to 15 seconds, to demonstrate improved functional strength for improved trasnfers.    Time  4    Period  Weeks    Status  New    Target Date  11/19/17      PT SHORT TERM GOAL #3   Title  Pt will improve Dynamic Gait Index score to at least 15/24 for decreased fall risk.    Time  4    Period  Weeks    Status  New    Target Date  11/19/17      PT SHORT TERM GOAL #4   Title  Pt will improve TUG score to less than or equal to 13 seconds for decreased fall risk.    Time  4    Period  Weeks    Target Date  11/19/17      PT SHORT TERM GOAL #5   Title  Pt will ambulate at least 500 ft, with appropriate assistive device, modified independently, for improved safety with community gait.    Time  4    Period  Weeks    Status  New    Target Date  11/19/17        PT Long Term Goals - 10/22/17 1330      PT LONG TERM GOAL #1   Title  Pt will verbalize understanding of  fall prevention in home/work environment.  TARGET 12/03/17    Time  6    Period  Weeks    Status  New      PT LONG TERM GOAL #2   Title  Pt will improve 5x sit<>stand test to less than or equal to 12.6 seconds for improved functional strength for improved transfers.    Time  6    Period  Weeks    Status  New    Target Date  12/03/17      PT LONG TERM GOAL #3   Title  Pt will improve DGI score to at least 19/24 for decreased fall risk.    Time  6    Period  Weeks    Status  New    Target Date  12/03/17      PT LONG TERM GOAL #4   Title  Pt will negotiate at least 4 steps with rails, step-through pattern, for improved independence with stair negotiation.    Time  6    Period  Weeks    Status  New    Target Date  12/03/17            Plan - 11/03/17 1527    Clinical Impression Statement  Progressing well with PT, continues to demonstrate poor core stability and balance.  Will continue to benefit from PT to maximize function.    PT Treatment/Interventions  ADLs/Self Care Home Management;DME Instruction;Balance training;Therapeutic exercise;Therapeutic activities;Functional mobility training;Stair training;Gait training;Neuromuscular re-education;Patient/family education;Orthotic Fit/Training    PT Next Visit Plan  gait training PRN (recommend RW which pt has but won't use); may need to assess SLS and tandem stance, core stability; continue balance and LE strengthening    Consulted and Agree with Plan of Care  Patient       Patient will benefit from skilled therapeutic intervention in order to improve the following deficits and impairments:  Abnormal gait, Decreased balance, Decreased endurance, Decreased mobility, Decreased safety awareness, Difficulty walking, Decreased strength, Postural dysfunction  Visit Diagnosis: Other abnormalities of  gait and mobility  Muscle weakness (generalized)  Unsteadiness on feet  Abnormal posture     Problem List Patient Active Problem  List   Diagnosis Date Noted  . Wears glasses 02/17/2011  . Arthritis 02/17/2011  . Menopause 02/17/2011      Laureen Abrahams, PT, DPT 11/03/17 3:28 PM    [facility] [address] [place], [place], [address] Phone: [phone]   Fax:  [phone]  Name: Rebecca Galloway MRN: [id number] Date of Birth: 1946/06/16

## 2017-11-08 ENCOUNTER — Ambulatory Visit: Payer: BC Managed Care – PPO | Admitting: Physical Therapy

## 2017-11-08 ENCOUNTER — Encounter: Payer: Self-pay | Admitting: Physical Therapy

## 2017-11-08 DIAGNOSIS — R2681 Unsteadiness on feet: Secondary | ICD-10-CM

## 2017-11-08 DIAGNOSIS — R293 Abnormal posture: Secondary | ICD-10-CM

## 2017-11-08 DIAGNOSIS — R2689 Other abnormalities of gait and mobility: Secondary | ICD-10-CM

## 2017-11-08 DIAGNOSIS — M6281 Muscle weakness (generalized): Secondary | ICD-10-CM

## 2017-11-08 NOTE — Therapy (Signed)
[facility] [address] [place], [place], [address] Phone: [phone]   Fax:  [phone]  Physical Therapy Treatment  Patient Details  Name: Rebecca Galloway MRN: [id number] Date of Birth: May 16, 1946 Referring Provider: Nicki Guadalajara   Encounter Date: 11/08/2017  PT End of Session - 11/08/17 1539    Visit Number  6    Number of Visits  13    Date for PT Re-Evaluation  12/21/17    Authorization Type  BCBS    PT Start Time  1449    PT Stop Time  1529    PT Time Calculation (min)  40 min    Equipment Utilized During Treatment  Gait belt    Activity Tolerance  Patient tolerated treatment well    Behavior During Therapy  [facility] for tasks assessed/performed       Past Medical History:  Diagnosis Date  . Hyperthyroidism    pt states is presently being worked up and was told she has hyperthyroidism, no meds  . Osteopenia   . Tendonitis     Past Surgical History:  Procedure Laterality Date  . CESAREAN SECTION  1986  . CESAREAN SECTION    . LAPAROSCOPY  03/12/2011   Procedure: LAPAROSCOPY OPERATIVE;  Surgeon: Melina Schools, MD;  Location: [facility];  Service: Gynecology;  Laterality: Left;  operative laparoscopy with left salping-oophorectomy    There were no vitals filed for this visit.  Subjective Assessment - 11/08/17 1452    Subjective  doing well today; had great days Thurs/Friday; then Sat/Sun felt she was more off balance and ataxic.  Feels today is a good day.    Patient Stated Goals  Pt's goal for physical therapy are to help with strengthening and manage symptoms of weakness.                      [facility] Adult PT Treatment/Exercise - 11/08/17 0001      Lumbar Exercises: Quadruped   Other Quadruped Lumbar Exercises  tall kneeling rollout with ball x 10       Knee/Hip Exercises: Supine   Bridges  10 reps;Both with feet walk out and back          Balance Exercises - 11/08/17 1454       Balance Exercises: Standing   Rockerboard  Anterior/posterior;Lateral;Head turns;EC;10 seconds;5 reps    Balance Beam  sidestepping; tandem walking; alt taps forward/backwards; alt kicks forward all with intermittent UE support          PT Short Term Goals - 10/22/17 1327      PT SHORT TERM GOAL #1   Title  Pt will be independent with HEP for improved strength, balance, gait.  TARGET 11/19/17    Time  4    Period  Weeks    Status  New    Target Date  11/19/17      PT SHORT TERM GOAL #2   Title  Pt will improve 5x sit<>stand to less than or equal to 15 seconds, to demonstrate improved functional strength for improved trasnfers.    Time  4    Period  Weeks    Status  New    Target Date  11/19/17      PT SHORT TERM GOAL #3   Title  Pt will improve Dynamic Gait Index score to at least 15/24 for decreased fall risk.    Time  4    Period  Weeks  Status  New    Target Date  11/19/17      PT SHORT TERM GOAL #4   Title  Pt will improve TUG score to less than or equal to 13 seconds for decreased fall risk.    Time  4    Period  Weeks    Target Date  11/19/17      PT SHORT TERM GOAL #5   Title  Pt will ambulate at least 500 ft, with appropriate assistive device, modified independently, for improved safety with community gait.    Time  4    Period  Weeks    Status  New    Target Date  11/19/17        PT Long Term Goals - 10/22/17 1330      PT LONG TERM GOAL #1   Title  Pt will verbalize understanding of fall prevention in home/work environment.  TARGET 12/03/17    Time  6    Period  Weeks    Status  New      PT LONG TERM GOAL #2   Title  Pt will improve 5x sit<>stand test to less than or equal to 12.6 seconds for improved functional strength for improved transfers.    Time  6    Period  Weeks    Status  New    Target Date  12/03/17      PT LONG TERM GOAL #3   Title  Pt will improve DGI score to at least 19/24 for decreased fall risk.    Time  6    Period   Weeks    Status  New    Target Date  12/03/17      PT LONG TERM GOAL #4   Title  Pt will negotiate at least 4 steps with rails, step-through pattern, for improved independence with stair negotiation.    Time  6    Period  Weeks    Status  New    Target Date  12/03/17            Plan - 11/08/17 1539    Clinical Impression Statement  Session today focused on balance and core stability needing intermittent UE support with standing balance activities.  Progressing well towards goals.    PT Treatment/Interventions  ADLs/Self Care Home Management;DME Instruction;Balance training;Therapeutic exercise;Therapeutic activities;Functional mobility training;Stair training;Gait training;Neuromuscular re-education;Patient/family education;Orthotic Fit/Training    PT Next Visit Plan  core stability; continue balance and LE strengthening    Consulted and Agree with Plan of Care  Patient       Patient will benefit from skilled therapeutic intervention in order to improve the following deficits and impairments:  Abnormal gait, Decreased balance, Decreased endurance, Decreased mobility, Decreased safety awareness, Difficulty walking, Decreased strength, Postural dysfunction  Visit Diagnosis: Other abnormalities of gait and mobility  Muscle weakness (generalized)  Unsteadiness on feet  Abnormal posture     Problem List Patient Active Problem List   Diagnosis Date Noted  . Wears glasses 02/17/2011  . Arthritis 02/17/2011  . Menopause 02/17/2011      Laureen Abrahams, PT, DPT 11/08/17 3:41 PM     [facility] [address] [place], [place], [address] Phone: [phone]   Fax:  [phone]  Name: Rebecca Galloway MRN: [id number] Date of Birth: 21-Oct-1945

## 2017-11-10 ENCOUNTER — Ambulatory Visit: Payer: BC Managed Care – PPO | Admitting: Physical Therapy

## 2017-11-10 ENCOUNTER — Encounter: Payer: Self-pay | Admitting: Physical Therapy

## 2017-11-10 DIAGNOSIS — R2689 Other abnormalities of gait and mobility: Secondary | ICD-10-CM

## 2017-11-10 DIAGNOSIS — R293 Abnormal posture: Secondary | ICD-10-CM

## 2017-11-10 DIAGNOSIS — R2681 Unsteadiness on feet: Secondary | ICD-10-CM

## 2017-11-10 DIAGNOSIS — M6281 Muscle weakness (generalized): Secondary | ICD-10-CM

## 2017-11-10 NOTE — Therapy (Signed)
[facility] [address] [place], [place], [address] Phone: [phone]   Fax:  [phone]  Physical Therapy Treatment  Patient Details  Name: Rebecca Galloway MRN: [id number] Date of Birth: 10/15/1945 Referring Provider: Nicki Guadalajara   Encounter Date: 11/10/2017  PT End of Session - 11/10/17 1532    Visit Number  7    Number of Visits  13    Date for PT Re-Evaluation  12/21/17    Authorization Type  BCBS    PT Start Time  1446    PT Stop Time  1527    PT Time Calculation (min)  41 min    Equipment Utilized During Treatment  Gait belt    Activity Tolerance  Patient tolerated treatment well    Behavior During Therapy  [facility] for tasks assessed/performed       Past Medical History:  Diagnosis Date  . Hyperthyroidism    pt states is presently being worked up and was told she has hyperthyroidism, no meds  . Osteopenia   . Tendonitis     Past Surgical History:  Procedure Laterality Date  . CESAREAN SECTION  1986  . CESAREAN SECTION    . LAPAROSCOPY  03/12/2011   Procedure: LAPAROSCOPY OPERATIVE;  Surgeon: Melina Schools, MD;  Location: [facility];  Service: Gynecology;  Laterality: Left;  operative laparoscopy with left salping-oophorectomy    There were no vitals filed for this visit.  Subjective Assessment - 11/10/17 1446    Subjective  tired today; had [employer] [facility] so had to run around getting kids to appts.    Patient Stated Goals  Pt's goal for physical therapy are to help with strengthening and manage symptoms of weakness.    Currently in Pain?  No/denies arthritis in knees; but not bothering her today                      [facility] Adult PT Treatment/Exercise - 11/10/17 0001      Lumbar Exercises: Quadruped   Other Quadruped Lumbar Exercises  modified quadruped (on elbows): hip extension x 5 reps; attempted with knee flexion able to complete ~ 3 reps bil      Knee/Hip Exercises: Seated    Sit to Sand  10 reps;without UE support blue foam; with green theraband maintaining hip abdct      Knee/Hip Exercises: Supine   Bridges  10 reps;Both with feet walk out and back    Bridges with Clamshell  10 reps isometric with green theraband          Balance Exercises - 11/10/17 1452      Balance Exercises: Standing   Standing Eyes Closed  Foam/compliant surface;Narrow base of support (BOS);3 reps;10 secs;Wide (BOA);Head turns    Balance Beam  red beam: horizontal/vertical head turns; alt taps forward/backwards with intermitent UE support; alt kicks forward/backwards with intermittent UE support; min A needed        PT Education - 11/10/17 1531    Education provided  Yes    Education Details  narrow BOS with HEP (not quite feet together)    Person(s) Educated  Patient    Methods  Explanation;Demonstration;Handout    Comprehension  Verbalized understanding;Returned demonstration;Need further instruction       PT Short Term Goals - 10/22/17 1327      PT SHORT TERM GOAL #1   Title  Pt will be independent with HEP for improved strength, balance, gait.  TARGET 11/19/17  Time  4    Period  Weeks    Status  New    Target Date  11/19/17      PT SHORT TERM GOAL #2   Title  Pt will improve 5x sit<>stand to less than or equal to 15 seconds, to demonstrate improved functional strength for improved trasnfers.    Time  4    Period  Weeks    Status  New    Target Date  11/19/17      PT SHORT TERM GOAL #3   Title  Pt will improve Dynamic Gait Index score to at least 15/24 for decreased fall risk.    Time  4    Period  Weeks    Status  New    Target Date  11/19/17      PT SHORT TERM GOAL #4   Title  Pt will improve TUG score to less than or equal to 13 seconds for decreased fall risk.    Time  4    Period  Weeks    Target Date  11/19/17      PT SHORT TERM GOAL #5   Title  Pt will ambulate at least 500 ft, with appropriate assistive device, modified independently, for  improved safety with community gait.    Time  4    Period  Weeks    Status  New    Target Date  11/19/17        PT Long Term Goals - 10/22/17 1330      PT LONG TERM GOAL #1   Title  Pt will verbalize understanding of fall prevention in home/work environment.  TARGET 12/03/17    Time  6    Period  Weeks    Status  New      PT LONG TERM GOAL #2   Title  Pt will improve 5x sit<>stand test to less than or equal to 12.6 seconds for improved functional strength for improved transfers.    Time  6    Period  Weeks    Status  New    Target Date  12/03/17      PT LONG TERM GOAL #3   Title  Pt will improve DGI score to at least 19/24 for decreased fall risk.    Time  6    Period  Weeks    Status  New    Target Date  12/03/17      PT LONG TERM GOAL #4   Title  Pt will negotiate at least 4 steps with rails, step-through pattern, for improved independence with stair negotiation.    Time  6    Period  Weeks    Status  New    Target Date  12/03/17            Plan - 11/10/17 1532    Clinical Impression Statement  Pt tolerated session well today, demonstrated continued core and hip weakness with all activities.  Progressing well with PT, and noticing improved stability with ambulation without device today.    PT Treatment/Interventions  ADLs/Self Care Home Management;DME Instruction;Balance training;Therapeutic exercise;Therapeutic activities;Functional mobility training;Stair training;Gait training;Neuromuscular re-education;Patient/family education;Orthotic Fit/Training    PT Next Visit Plan  core stability; continue balance and LE strengthening; begin checking goals    Consulted and Agree with Plan of Care  Patient       Patient will benefit from skilled therapeutic intervention in order to improve the following deficits and impairments:  Abnormal gait, Decreased balance,  Decreased endurance, Decreased mobility, Decreased safety awareness, Difficulty walking, Decreased strength,  Postural dysfunction  Visit Diagnosis: Other abnormalities of gait and mobility  Muscle weakness (generalized)  Unsteadiness on feet  Abnormal posture     Problem List Patient Active Problem List   Diagnosis Date Noted  . Wears glasses 02/17/2011  . Arthritis 02/17/2011  . Menopause 02/17/2011      Laureen Abrahams, PT, DPT 11/10/17 3:35 PM    [facility] [address] [place], [place], [address] Phone: [phone]   Fax:  [phone]  Name: BELVA KOZIEL MRN: [id number] Date of Birth: 1946/06/25

## 2017-11-15 ENCOUNTER — Ambulatory Visit: Payer: BC Managed Care – PPO | Admitting: Physical Therapy

## 2017-11-15 ENCOUNTER — Encounter: Payer: Self-pay | Admitting: Physical Therapy

## 2017-11-15 DIAGNOSIS — R2681 Unsteadiness on feet: Secondary | ICD-10-CM

## 2017-11-15 DIAGNOSIS — R293 Abnormal posture: Secondary | ICD-10-CM

## 2017-11-15 DIAGNOSIS — M6281 Muscle weakness (generalized): Secondary | ICD-10-CM

## 2017-11-15 DIAGNOSIS — R2689 Other abnormalities of gait and mobility: Secondary | ICD-10-CM | POA: Diagnosis not present

## 2017-11-15 NOTE — Therapy (Signed)
[facility] [address] [place], [place], [address] Phone: [phone]   Fax:  [phone]  Physical Therapy Treatment  Patient Details  Name: Rebecca Galloway MRN: [id number] Date of Birth: October 30, 1945 Referring Provider: Nicki Guadalajara   Encounter Date: 11/15/2017  PT End of Session - 11/15/17 1526    Visit Number  8    Number of Visits  13    Date for PT Re-Evaluation  12/21/17    Authorization Type  BCBS    PT Start Time  1447    PT Stop Time  1526    PT Time Calculation (min)  39 min    Equipment Utilized During Treatment  Gait belt    Activity Tolerance  Patient tolerated treatment well    Behavior During Therapy  [facility] for tasks assessed/performed       Past Medical History:  Diagnosis Date  . Hyperthyroidism    pt states is presently being worked up and was told she has hyperthyroidism, no meds  . Osteopenia   . Tendonitis     Past Surgical History:  Procedure Laterality Date  . CESAREAN SECTION  1986  . CESAREAN SECTION    . LAPAROSCOPY  03/12/2011   Procedure: LAPAROSCOPY OPERATIVE;  Surgeon: Melina Schools, MD;  Location: [facility];  Service: Gynecology;  Laterality: Left;  operative laparoscopy with left salping-oophorectomy    There were no vitals filed for this visit.  Subjective Assessment - 11/15/17 1451    Subjective  MD feels this may be AIDP after visit today.      Patient Stated Goals  Pt's goal for physical therapy are to help with strengthening and manage symptoms of weakness.    Currently in Pain?  No/denies         [facility] PT Assessment - 11/15/17 1454      Transfers   Five time sit to stand comments   14.59      Ambulation/Gait   Ambulation/Gait  Yes    Ambulation/Gait Assistance  5: Supervision    Ambulation Distance (Feet)  500 Feet    Assistive device  None    Gait Pattern  Step-through pattern;Decreased dorsiflexion - right;Decreased dorsiflexion -  left;Trendelenburg;Lateral trunk lean to right;Lateral trunk lean to left;Poor foot clearance - left;Poor foot clearance - right;Narrow base of support    Ambulation Surface  Level;Unlevel;Outdoor;Indoor;Grass mulch      Dynamic Gait Index   Level Surface  Normal    Change in Gait Speed  Normal    Gait with Horizontal Head Turns  Mild Impairment    Gait with Vertical Head Turns  Mild Impairment    Gait and Pivot Turn  Normal    Step Over Obstacle  Mild Impairment    Step Around Obstacles  Normal    Steps  Mild Impairment    Total Score  20      Timed Up and Go Test   Normal TUG (seconds)  11.75                  [facility] Adult PT Treatment/Exercise - 11/15/17 1454      Self-Care   Self-Care  Other Self-Care Comments    Other Self-Care Comments   fall prevention strategies      Knee/Hip Exercises: Machines for Strengthening   Total Gym Leg Press  50# 2x10; calf raises 2x10 50#             PT Education - 11/15/17 1526  Education provided  Yes    Education Details  fall prevention    Person(s) Educated  Patient    Methods  Explanation;Handout    Comprehension  Verbalized understanding       PT Short Term Goals - 11/15/17 1526      PT SHORT TERM GOAL #1   Title  Pt will be independent with HEP for improved strength, balance, gait.  TARGET 11/19/17    Status  On-going      PT SHORT TERM GOAL #2   Title  Pt will improve 5x sit<>stand to less than or equal to 15 seconds, to demonstrate improved functional strength for improved trasnfers.    Status  Achieved      PT SHORT TERM GOAL #3   Title  Pt will improve Dynamic Gait Index score to at least 15/24 for decreased fall risk.    Status  Achieved      PT SHORT TERM GOAL #4   Title  Pt will improve TUG score to less than or equal to 13 seconds for decreased fall risk.    Status  Achieved      PT SHORT TERM GOAL #5   Title  Pt will ambulate at least 500 ft, with appropriate assistive device, modified  independently, for improved safety with community gait.    Status  Partially Met        PT Long Term Goals - 10/22/17 1330      PT LONG TERM GOAL #1   Title  Pt will verbalize understanding of fall prevention in home/work environment.  TARGET 12/03/17    Time  6    Period  Weeks    Status  New      PT LONG TERM GOAL #2   Title  Pt will improve 5x sit<>stand test to less than or equal to 12.6 seconds for improved functional strength for improved transfers.    Time  6    Period  Weeks    Status  New    Target Date  12/03/17      PT LONG TERM GOAL #3   Title  Pt will improve DGI score to at least 19/24 for decreased fall risk.    Time  6    Period  Weeks    Status  New    Target Date  12/03/17      PT LONG TERM GOAL #4   Title  Pt will negotiate at least 4 steps with rails, step-through pattern, for improved independence with stair negotiation.    Time  6    Period  Weeks    Status  New    Target Date  12/03/17            Plan - 11/15/17 1527    Clinical Impression Statement  Pt has met 3/5 STGs and partially met STG #5 (still supervision on compliant surfaces), and did not check STG #1 due to time constraints.  Progressing well with PT but reports symptoms continue to be intermittent.    PT Treatment/Interventions  ADLs/Self Care Home Management;DME Instruction;Balance training;Therapeutic exercise;Therapeutic activities;Functional mobility training;Stair training;Gait training;Neuromuscular re-education;Patient/family education;Orthotic Fit/Training    PT Next Visit Plan  core stability; continue balance and LE strengthening; check HEP (STG #1)    Consulted and Agree with Plan of Care  Patient       Patient will benefit from skilled therapeutic intervention in order to improve the following deficits and impairments:  Abnormal gait, Decreased balance, Decreased endurance,  Decreased mobility, Decreased safety awareness, Difficulty walking, Decreased strength, Postural  dysfunction  Visit Diagnosis: Other abnormalities of gait and mobility  Muscle weakness (generalized)  Unsteadiness on feet  Abnormal posture     Problem List Patient Active Problem List   Diagnosis Date Noted  . Wears glasses 02/17/2011  . Arthritis 02/17/2011  . Menopause 02/17/2011      Laureen Abrahams, PT, DPT 11/15/17 3:31 PM    [facility] [address] [place], [place], [address] Phone: [phone]   Fax:  [phone]  Name: Rebecca Galloway MRN: [id number] Date of Birth: 1946/03/30

## 2017-11-15 NOTE — Patient Instructions (Signed)

## 2017-11-17 ENCOUNTER — Ambulatory Visit: Payer: BC Managed Care – PPO | Admitting: Physical Therapy

## 2017-11-17 ENCOUNTER — Encounter: Payer: Self-pay | Admitting: Physical Therapy

## 2017-11-17 DIAGNOSIS — M6281 Muscle weakness (generalized): Secondary | ICD-10-CM

## 2017-11-17 DIAGNOSIS — R2681 Unsteadiness on feet: Secondary | ICD-10-CM

## 2017-11-17 DIAGNOSIS — R2689 Other abnormalities of gait and mobility: Secondary | ICD-10-CM | POA: Diagnosis not present

## 2017-11-17 DIAGNOSIS — R293 Abnormal posture: Secondary | ICD-10-CM

## 2017-11-17 NOTE — Therapy (Signed)
[facility] [address] [place], [place], [address] Phone: [phone]   Fax:  [phone]  Physical Therapy Treatment  Patient Details  Name: Rebecca Galloway MRN: [id number] Date of Birth: [birth year] Referring Provider: Nicki Guadalajara   Encounter Date: 11/17/2017  PT End of Session - 11/17/17 1616    Visit Number  9    Number of Visits  13    Date for PT Re-Evaluation  12/21/17    Authorization Type  BCBS    PT Start Time  1532    PT Stop Time  1613    PT Time Calculation (min)  41 min    Activity Tolerance  Patient tolerated treatment well    Behavior During Therapy  [facility] for tasks assessed/performed       Past Medical History:  Diagnosis Date  . Hyperthyroidism    pt states is presently being worked up and was told she has hyperthyroidism, no meds  . Osteopenia   . Tendonitis     Past Surgical History:  Procedure Laterality Date  . CESAREAN SECTION  1986  . CESAREAN SECTION    . LAPAROSCOPY  03/12/2011   Procedure: LAPAROSCOPY OPERATIVE;  Surgeon: Melina Schools, MD;  Location: [facility];  Service: Gynecology;  Laterality: Left;  operative laparoscopy with left salping-oophorectomy    There were no vitals filed for this visit.  Subjective Assessment - 11/17/17 1535    Subjective  having more good days, IVIG isn't scheduled yet, but planned.    Patient Stated Goals  Pt's goal for physical therapy are to help with strengthening and manage symptoms of weakness.    Currently in Pain?  No/denies                      [facility] Adult PT Treatment/Exercise - 11/17/17 1539      Knee/Hip Exercises: Seated   Sit to Sand  15 reps;without UE support      Knee/Hip Exercises: Supine   Bridges  Both;15 reps    Straight Leg Raises  Both;15 reps    Straight Leg Raises Limitations  decreased eccentric control      Knee/Hip Exercises: Sidelying   Clams  x15 bil; green theraband      Ankle  Exercises: Seated   Other Seated Ankle Exercises  seated ankle 4-way with green theraband x 15 reps (without inversion)          Balance Exercises - 11/17/17 1608      Balance Exercises: Standing   Standing Eyes Opened  Narrow base of support (BOS);Head turns;Foam/compliant surface    Standing Eyes Closed  Wide (BOA);Foam/compliant surface;3 reps;20 secs          PT Short Term Goals - 11/17/17 1616      PT SHORT TERM GOAL #1   Title  Pt will be independent with HEP for improved strength, balance, gait.  TARGET 11/19/17    Status  Achieved      PT SHORT TERM GOAL #2   Title  Pt will improve 5x sit<>stand to less than or equal to 15 seconds, to demonstrate improved functional strength for improved trasnfers.    Status  Achieved      PT SHORT TERM GOAL #3   Title  Pt will improve Dynamic Gait Index score to at least 15/24 for decreased fall risk.    Status  Achieved      PT SHORT TERM GOAL #4   Title  Pt will improve TUG score to less than or equal to 13 seconds for decreased fall risk.    Status  Achieved      PT SHORT TERM GOAL #5   Title  Pt will ambulate at least 500 ft, with appropriate assistive device, modified independently, for improved safety with community gait.    Status  Partially Met        PT Long Term Goals - 10/22/17 1330      PT LONG TERM GOAL #1   Title  Pt will verbalize understanding of fall prevention in home/work environment.  TARGET 12/03/17    Time  6    Period  Weeks    Status  New      PT LONG TERM GOAL #2   Title  Pt will improve 5x sit<>stand test to less than or equal to 12.6 seconds for improved functional strength for improved transfers.    Time  6    Period  Weeks    Status  New    Target Date  12/03/17      PT LONG TERM GOAL #3   Title  Pt will improve DGI score to at least 19/24 for decreased fall risk.    Time  6    Period  Weeks    Status  New    Target Date  12/03/17      PT LONG TERM GOAL #4   Title  Pt will negotiate  at least 4 steps with rails, step-through pattern, for improved independence with stair negotiation.    Time  6    Period  Weeks    Status  New    Target Date  12/03/17            Plan - 11/17/17 1616    Clinical Impression Statement  Pt has met 4/5 STGs and partially met STG #5 due to supervision on compliant surfaces.  Still with significant proximal weakness affecting balance and mobilty.  Will continue to beneifit from PT to maximize function.    PT Treatment/Interventions  ADLs/Self Care Home Management;DME Instruction;Balance training;Therapeutic exercise;Therapeutic activities;Functional mobility training;Stair training;Gait training;Neuromuscular re-education;Patient/family education;Orthotic Fit/Training    PT Next Visit Plan  core stability; continue balance and LE strengthening    Consulted and Agree with Plan of Care  Patient       Patient will benefit from skilled therapeutic intervention in order to improve the following deficits and impairments:  Abnormal gait, Decreased balance, Decreased endurance, Decreased mobility, Decreased safety awareness, Difficulty walking, Decreased strength, Postural dysfunction  Visit Diagnosis: Other abnormalities of gait and mobility  Muscle weakness (generalized)  Unsteadiness on feet  Abnormal posture     Problem List Patient Active Problem List   Diagnosis Date Noted  . Wears glasses 02/17/2011  . Arthritis 02/17/2011  . Menopause 02/17/2011      Laureen Abrahams, PT, DPT 11/17/17 4:19 PM     [facility] [address] [place], [place], [address] Phone: [phone]   Fax:  [phone]  Name: Rebecca Galloway MRN: [id number] Date of Birth: 07-12-1946

## 2017-11-23 ENCOUNTER — Encounter: Payer: Self-pay | Admitting: Physical Therapy

## 2017-11-23 ENCOUNTER — Ambulatory Visit: Payer: BC Managed Care – PPO | Admitting: Physical Therapy

## 2017-11-23 DIAGNOSIS — M6281 Muscle weakness (generalized): Secondary | ICD-10-CM

## 2017-11-23 DIAGNOSIS — R2681 Unsteadiness on feet: Secondary | ICD-10-CM

## 2017-11-23 DIAGNOSIS — R2689 Other abnormalities of gait and mobility: Secondary | ICD-10-CM | POA: Diagnosis not present

## 2017-11-24 NOTE — Therapy (Signed)
[facility] [address] [place], [place], [address] Phone: [phone]   Fax:  [phone]  Physical Therapy Treatment  Patient Details  Name: Rebecca Galloway MRN: [id number] Date of Birth: 04-06-1946 Referring Provider: Nicki Guadalajara   Encounter Date: 11/23/2017  PT End of Session - 11/24/17 0833    Visit Number  10    Number of Visits  13    Date for PT Re-Evaluation  12/21/17    Authorization Type  BCBS    PT Start Time  1147    PT Stop Time  1229    PT Time Calculation (min)  42 min    Activity Tolerance  Patient tolerated treatment well    Behavior During Therapy  [facility] for tasks assessed/performed       Past Medical History:  Diagnosis Date  . Hyperthyroidism    pt states is presently being worked up and was told she has hyperthyroidism, no meds  . Osteopenia   . Tendonitis     Past Surgical History:  Procedure Laterality Date  . CESAREAN SECTION  1986  . CESAREAN SECTION    . LAPAROSCOPY  03/12/2011   Procedure: LAPAROSCOPY OPERATIVE;  Surgeon: Melina Schools, MD;  Location: [facility];  Service: Gynecology;  Laterality: Left;  operative laparoscopy with left salping-oophorectomy    There were no vitals filed for this visit.  Subjective Assessment - 11/23/17 1149    Subjective  All in all, I've having improvement, but I still have some days that are better than others.  I have been approved for the IVIG treatments next week-2 treatments    Pertinent History  OA bilateral knees, pre-diabetes, thyroid disease; Dr. Hassell Halim note says transvers myelitis?    Patient Stated Goals  Pt's goal for physical therapy are to help with strengthening and manage symptoms of weakness.    Currently in Pain?  No/denies                       [facility] Adult PT Treatment/Exercise - 11/24/17 0821      Transfers   Transfers  Sit to Stand;Stand to Sit    Sit to Stand  5: Supervision;Without upper  extremity assist;From bed;From chair/3-in-1    Stand to Sit  5: Supervision;Without upper extremity assist;To bed;To chair/3-in-1    Number of Reps  10 reps      Knee/Hip Exercises: Standing   Heel Raises  Both;1 set;10 reps toe raises    Knee Flexion  Both;15 reps 2#    Hip Flexion  Both;15 reps;Knee bent 2#    Hip Abduction  Both;15 reps;Knee straight 2#      Knee/Hip Exercises: Supine   Bridges  Both;2 sets;10 reps    Bridges with Clamshell  10 reps    Straight Leg Raises  Both;15 reps    Straight Leg Raises Limitations  decreased eccentric control      Knee/Hip Exercises: Sidelying   Clams  x15 bil; green theraband          Balance Exercises - 11/24/17 0826      Balance Exercises: Standing   Tandem Stance  Eyes open;Intermittent upper extremity support;3 reps;30 secs    Balance Beam  balance beam:  feet apart with head turns, head nods, then marching in place, forward kicks, cues for increased control of movement patterns          PT Short Term Goals - 11/17/17 1616  PT SHORT TERM GOAL #1   Title  Pt will be independent with HEP for improved strength, balance, gait.  TARGET 11/19/17    Status  Achieved      PT SHORT TERM GOAL #2   Title  Pt will improve 5x sit<>stand to less than or equal to 15 seconds, to demonstrate improved functional strength for improved trasnfers.    Status  Achieved      PT SHORT TERM GOAL #3   Title  Pt will improve Dynamic Gait Index score to at least 15/24 for decreased fall risk.    Status  Achieved      PT SHORT TERM GOAL #4   Title  Pt will improve TUG score to less than or equal to 13 seconds for decreased fall risk.    Status  Achieved      PT SHORT TERM GOAL #5   Title  Pt will ambulate at least 500 ft, with appropriate assistive device, modified independently, for improved safety with community gait.    Status  Partially Met        PT Long Term Goals - 10/22/17 1330      PT LONG TERM GOAL #1   Title  Pt will  verbalize understanding of fall prevention in home/work environment.  TARGET 12/03/17    Time  6    Period  Weeks    Status  New      PT LONG TERM GOAL #2   Title  Pt will improve 5x sit<>stand test to less than or equal to 12.6 seconds for improved functional strength for improved transfers.    Time  6    Period  Weeks    Status  New    Target Date  12/03/17      PT LONG TERM GOAL #3   Title  Pt will improve DGI score to at least 19/24 for decreased fall risk.    Time  6    Period  Weeks    Status  New    Target Date  12/03/17      PT LONG TERM GOAL #4   Title  Pt will negotiate at least 4 steps with rails, step-through pattern, for improved independence with stair negotiation.    Time  6    Period  Weeks    Status  New    Target Date  12/03/17            Plan - 11/24/17 2633    Clinical Impression Statement  Continued to address trunk and lower extremity strengthening with supine, sit to stand and standing activities.  Cues provided throughout exercises to engage abdominal muscles for improved stability and control.  Pt does note she has multiple exercises for home, but she is not consistently doing them.  Discussed trying to incorporate HEP into daily activities or at end of work day for more consistent performance.  Will continue to benefit from continued PT to address strength, balance, gait.    PT Treatment/Interventions  ADLs/Self Care Home Management;DME Instruction;Balance training;Therapeutic exercise;Therapeutic activities;Functional mobility training;Stair training;Gait training;Neuromuscular re-education;Patient/family education;Orthotic Fit/Training    PT Next Visit Plan  core stability; continue balance and LE strengthening    Consulted and Agree with Plan of Care  Patient       Patient will benefit from skilled therapeutic intervention in order to improve the following deficits and impairments:  Abnormal gait, Decreased balance, Decreased endurance, Decreased  mobility, Decreased safety awareness, Difficulty walking, Decreased strength, Postural dysfunction  Visit Diagnosis: Muscle weakness (generalized)  Unsteadiness on feet     Problem List Patient Active Problem List   Diagnosis Date Noted  . Wears glasses 02/17/2011  . Arthritis 02/17/2011  . Menopause 02/17/2011    Alic Hilburn W. 11/24/2017, 8:35 AM  Frazier Butt., PT  [facility] [address] [place], [place], [address] Phone: [phone]   Fax:  [phone]  Name: Rebecca Galloway MRN: [id number] Date of Birth: Apr 11, 1946

## 2017-11-26 ENCOUNTER — Ambulatory Visit: Payer: BC Managed Care – PPO | Admitting: Physical Therapy

## 2017-11-26 ENCOUNTER — Encounter: Payer: Self-pay | Admitting: Physical Therapy

## 2017-11-26 DIAGNOSIS — R2689 Other abnormalities of gait and mobility: Secondary | ICD-10-CM | POA: Diagnosis not present

## 2017-11-26 DIAGNOSIS — M6281 Muscle weakness (generalized): Secondary | ICD-10-CM

## 2017-11-26 DIAGNOSIS — R2681 Unsteadiness on feet: Secondary | ICD-10-CM

## 2017-11-26 NOTE — Therapy (Signed)
[facility] [address] [place], [place], [address] Phone: [phone]   Fax:  [phone]  Physical Therapy Treatment  Patient Details  Name: Rebecca Galloway MRN: [id number] Date of Birth: 04-02-1946 Referring Provider: Nicki Guadalajara   Encounter Date: 11/26/2017  PT End of Session - 11/26/17 1343    Visit Number  11    Number of Visits  13    Date for PT Re-Evaluation  12/21/17    Authorization Type  BCBS    PT Start Time  0933    PT Stop Time  1014    PT Time Calculation (min)  41 min    Activity Tolerance  Patient tolerated treatment well    Behavior During Therapy  [facility] for tasks assessed/performed       Past Medical History:  Diagnosis Date  . Hyperthyroidism    pt states is presently being worked up and was told she has hyperthyroidism, no meds  . Osteopenia   . Tendonitis     Past Surgical History:  Procedure Laterality Date  . CESAREAN SECTION  1986  . CESAREAN SECTION    . LAPAROSCOPY  03/12/2011   Procedure: LAPAROSCOPY OPERATIVE;  Surgeon: Melina Schools, MD;  Location: [facility];  Service: Gynecology;  Laterality: Left;  operative laparoscopy with left salping-oophorectomy    There were no vitals filed for this visit.  Subjective Assessment - 11/26/17 0935    Subjective  Feel like all this week I've been stronger-able to see 5 kids (at school therapy before fatigueing-has been just 2-3 at most).  To get the IVIG Monday and Tuesday next week; been trying to fit my exercises into the end of my work day.    Pertinent History  OA bilateral knees, pre-diabetes, thyroid disease; Dr. Hassell Halim note says transvers myelitis?    Patient Stated Goals  Pt's goal for physical therapy are to help with strengthening and manage symptoms of weakness.    Currently in Pain?  No/denies                No data recorded       [facility] Adult PT Treatment/Exercise - 11/26/17 0001      Transfers    Transfers  Sit to Stand;Stand to Sit    Sit to Stand  6: Modified independent (Device/Increase time);Without upper extremity assist;From chair/3-in-1;From bed    Stand to Sit  6: Modified independent (Device/Increase time);Without upper extremity assist;To chair/3-in-1;To bed    Number of Reps  10 reps from mat, then from 18" chair x 6 reps      Ambulation/Gait   Ambulation/Gait  Yes    Ambulation/Gait Assistance  5: Supervision    Ambulation Distance (Feet)  50 Feet x 8 reps with turns    Assistive device  None    Gait Pattern  Step-through pattern;Decreased dorsiflexion - right;Decreased dorsiflexion - left;Trendelenburg;Lateral trunk lean to right;Lateral trunk lean to left;Poor foot clearance - left;Poor foot clearance - right;Narrow base of support    Ambulation Surface  Level;Indoor    Gait Comments  Forward/back gait, gait with head turns and head nods; gait with obstacle negotiation, stepping over and stepping around obstacles          Balance Exercises - 11/26/17 0940      Balance Exercises: Standing   Tandem Stance  Eyes open;Intermittent upper extremity support;3 reps;30 secs Min guard assistance provided    Wall Bumps  Hip    Wall Bumps-Hips  Eyes  opened;Anterior/posterior;10 reps    Stepping Strategy  Anterior;Posterior;Lateral;5 reps alternating legs    Balance Beam  balance beam:  feet apart with head turns, head nods, then marching in place, forward kicks, cues for increased control of movement patterns    Gait with Head Turns  Forward;Other reps (comment) 25 ft x 6 reps    Tandem Gait  Forward;Retro;Upper extremity support;3 reps 6 ft in parallel bars    Sidestepping  3 reps along parallel bars R and L    Marching Limitations  Attempted marching forward, with pt having instability with SLS-cues provided to hold for support, 2 reps in bars    Heel Raises Limitations  x 10    Toe Raise Limitations  x 10    Other Standing Exercises  Step taps to 6" step x 10 reps, then  12" step, then triple step taps x 5 reps; then side step taps x 10 reps no UE support and cues for upright posture.  Occasional cues provided for abdominal activation for upright posture throughout standing activities.          PT Short Term Goals - 11/17/17 1616      PT SHORT TERM GOAL #1   Title  Pt will be independent with HEP for improved strength, balance, gait.  TARGET 11/19/17    Status  Achieved      PT SHORT TERM GOAL #2   Title  Pt will improve 5x sit<>stand to less than or equal to 15 seconds, to demonstrate improved functional strength for improved trasnfers.    Status  Achieved      PT SHORT TERM GOAL #3   Title  Pt will improve Dynamic Gait Index score to at least 15/24 for decreased fall risk.    Status  Achieved      PT SHORT TERM GOAL #4   Title  Pt will improve TUG score to less than or equal to 13 seconds for decreased fall risk.    Status  Achieved      PT SHORT TERM GOAL #5   Title  Pt will ambulate at least 500 ft, with appropriate assistive device, modified independently, for improved safety with community gait.    Status  Partially Met        PT Long Term Goals - 10/22/17 1330      PT LONG TERM GOAL #1   Title  Pt will verbalize understanding of fall prevention in home/work environment.  TARGET 12/03/17    Time  6    Period  Weeks    Status  New      PT LONG TERM GOAL #2   Title  Pt will improve 5x sit<>stand test to less than or equal to 12.6 seconds for improved functional strength for improved transfers.    Time  6    Period  Weeks    Status  New    Target Date  12/03/17      PT LONG TERM GOAL #3   Title  Pt will improve DGI score to at least 19/24 for decreased fall risk.    Time  6    Period  Weeks    Status  New    Target Date  12/03/17      PT LONG TERM GOAL #4   Title  Pt will negotiate at least 4 steps with rails, step-through pattern, for improved independence with stair negotiation.    Time  6    Period  Weeks  Status  New     Target Date  12/03/17            Plan - 11/26/17 1346    Clinical Impression Statement  Pt demonstrates improved trunk control with decreased lateral sway during standing and gait activities today.  Pt feels balance is improving, but still her focus.  She feels that she may be ready for discharge next week following her IVIG treatments.    PT Treatment/Interventions  ADLs/Self Care Home Management;DME Instruction;Balance training;Therapeutic exercise;Therapeutic activities;Functional mobility training;Stair training;Gait training;Neuromuscular re-education;Patient/family education;Orthotic Fit/Training    PT Next Visit Plan  Continue standing balance and dynamic balance and gait activities.    Consulted and Agree with Plan of Care  Patient       Patient will benefit from skilled therapeutic intervention in order to improve the following deficits and impairments:  Abnormal gait, Decreased balance, Decreased endurance, Decreased mobility, Decreased safety awareness, Difficulty walking, Decreased strength, Postural dysfunction  Visit Diagnosis: Unsteadiness on feet  Other abnormalities of gait and mobility  Muscle weakness (generalized)     Problem List Patient Active Problem List   Diagnosis Date Noted  . Wears glasses 02/17/2011  . Arthritis 02/17/2011  . Menopause 02/17/2011    Quamel Fitzmaurice W. 11/26/2017, 1:48 PM  Frazier Butt., PT   [facility] [address] [place], [place], [address] Phone: [phone]   Fax:  [phone]  Name: Rebecca Galloway MRN: [id number] Date of Birth: 25-Nov-1945

## 2017-11-29 ENCOUNTER — Encounter: Payer: BC Managed Care – PPO | Admitting: Physical Therapy

## 2017-12-01 ENCOUNTER — Ambulatory Visit: Payer: BC Managed Care – PPO | Admitting: Physical Therapy

## 2017-12-03 ENCOUNTER — Ambulatory Visit: Payer: BC Managed Care – PPO | Admitting: Physical Therapy

## 2017-12-06 ENCOUNTER — Ambulatory Visit: Payer: BC Managed Care – PPO | Admitting: Physical Therapy

## 2017-12-08 ENCOUNTER — Ambulatory Visit: Payer: BC Managed Care – PPO | Attending: Neurology | Admitting: Physical Therapy

## 2017-12-08 ENCOUNTER — Encounter: Payer: Self-pay | Admitting: Physical Therapy

## 2017-12-08 DIAGNOSIS — R293 Abnormal posture: Secondary | ICD-10-CM | POA: Diagnosis present

## 2017-12-08 DIAGNOSIS — M6281 Muscle weakness (generalized): Secondary | ICD-10-CM

## 2017-12-08 DIAGNOSIS — R2681 Unsteadiness on feet: Secondary | ICD-10-CM | POA: Diagnosis present

## 2017-12-08 DIAGNOSIS — R2689 Other abnormalities of gait and mobility: Secondary | ICD-10-CM | POA: Diagnosis present

## 2017-12-09 NOTE — Therapy (Signed)
[facility] [address] [place], [place], [address] Phone: [phone]   Fax:  [phone]  Physical Therapy Treatment  Patient Details  Name: Rebecca Galloway MRN: [id number] Date of Birth: [birth year] Referring Provider: Nicki Guadalajara   Encounter Date: 12/08/2017  PT End of Session - 12/09/17 1251    Visit Number  12    Number of Visits  16 per recert 7/42/59    Date for PT Re-Evaluation  12/21/17    Authorization Type  BCBS    PT Start Time  1017    PT Stop Time  1057    PT Time Calculation (min)  40 min    Activity Tolerance  Patient tolerated treatment well    Behavior During Therapy  [facility] for tasks assessed/performed       Past Medical History:  Diagnosis Date  . Hyperthyroidism    pt states is presently being worked up and was told she has hyperthyroidism, no meds  . Osteopenia   . Tendonitis     Past Surgical History:  Procedure Laterality Date  . CESAREAN SECTION  1986  . CESAREAN SECTION    . LAPAROSCOPY  03/12/2011   Procedure: LAPAROSCOPY OPERATIVE;  Surgeon: Melina Schools, MD;  Location: [facility];  Service: Gynecology;  Laterality: Left;  operative laparoscopy with left salping-oophorectomy    There were no vitals filed for this visit.  Subjective Assessment - 12/08/17 1020    Subjective  Was very tired all last week after the IVIG treatments on Monday/Tuesday of last week.  Then had a flat tire on Monday.  As I'm progressing, I feel like I'm getting back to where I was (before the IVIG).    Pertinent History  OA bilateral knees, pre-diabetes, thyroid disease; Dr. Hassell Halim note says transverse myelitis?; IVIG treatments 11/29/17, 11/30/17    Patient Stated Goals  Pt's goal for physical therapy are to help with strengthening and manage symptoms of weakness.    Currently in Pain?  No/denies                       [facility] Adult PT Treatment/Exercise - 12/08/17 1025      Transfers    Transfers  Sit to Stand;Stand to Sit    Sit to Stand  6: Modified independent (Device/Increase time);Without upper extremity assist;From chair/3-in-1;From bed    Five time sit to stand comments   15.13    Stand to Sit  6: Modified independent (Device/Increase time);Without upper extremity assist;To chair/3-in-1;To bed;Uncontrolled descent      Ambulation/Gait   Ambulation/Gait  Yes    Ambulation/Gait Assistance  5: Supervision    Ambulation Distance (Feet)  200 Feet    Assistive device  None    Gait Pattern  Step-through pattern;Decreased dorsiflexion - right;Decreased dorsiflexion - left;Trendelenburg;Poor foot clearance - left;Poor foot clearance - right;Narrow base of support    Ambulation Surface  Level;Indoor    Gait velocity  10.25= 3.2 ft/sec    Stairs  Yes    Stairs Assistance  6: Modified independent (Device/Increase time)    Stair Management Technique  One rail Right;Step to pattern;Alternating pattern;Forwards    Number of Stairs  4 x 2    Height of Stairs  6      Standardized Balance Assessment   Standardized Balance Assessment  Timed Up and Go Test;Dynamic Gait Index      Dynamic Gait Index   Level Surface  Mild Impairment  Change in Gait Speed  Normal    Gait with Horizontal Head Turns  Mild Impairment    Gait with Vertical Head Turns  Mild Impairment    Gait and Pivot Turn  Normal    Step Over Obstacle  Moderate Impairment    Step Around Obstacles  Normal    Steps  Mild Impairment    Total Score  18      Timed Up and Go Test   TUG  Normal TUG    Normal TUG (seconds)  14      Self-Care   Self-Care  Other Self-Care Comments    Other Self-Care Comments   Discussed progress towards LTGs, objective measures compared to measures taken mid-March (slowed 5x sit<>stand, decr. DGI score, slowed TUG since eval)-Pt feels part of this regression may be due to IVIG treatment last week.  Discussed POC and agreed to continue 1x/wk for 4 weeks to work on progression of HEP  and continued gait training      Knee/Hip Exercises: Standing   Knee Flexion  Right;Left;2 sets;10 reps    Hip Flexion  Right;Left;2 sets;10 reps    Hip Abduction  Right;Left;2 sets;10 reps    Other Standing Knee Exercises  Tandem stance, 3 reps 10 seconds intermittent UE support       No weights added to HEP this visit.  DIscussed trying to incorporate standing exercises into her HEP routine (will try to update next visit).  Cues provided for slow, controlled pace and trunk control.       PT Education - 12/09/17 1250    Education provided  Yes    Education Details  POC, progress towards goals, objective measures compared to STG check (pt has experienced some decline-perhaps due to recent IVIG treatment); agrees to continuing PT 1x/wk for 4 weeks    Person(s) Educated  Patient    Methods  Explanation    Comprehension  Verbalized understanding       PT Short Term Goals - 11/17/17 1616      PT SHORT TERM GOAL #1   Title  Pt will be independent with HEP for improved strength, balance, gait.  TARGET 11/19/17    Status  Achieved      PT SHORT TERM GOAL #2   Title  Pt will improve 5x sit<>stand to less than or equal to 15 seconds, to demonstrate improved functional strength for improved trasnfers.    Status  Achieved      PT SHORT TERM GOAL #3   Title  Pt will improve Dynamic Gait Index score to at least 15/24 for decreased fall risk.    Status  Achieved      PT SHORT TERM GOAL #4   Title  Pt will improve TUG score to less than or equal to 13 seconds for decreased fall risk.    Status  Achieved      PT SHORT TERM GOAL #5   Title  Pt will ambulate at least 500 ft, with appropriate assistive device, modified independently, for improved safety with community gait.    Status  Partially Met        PT Long Term Goals - 12/08/17 1023      PT LONG TERM GOAL #1   Title  Pt will verbalize understanding of fall prevention in home/work environment.      Time  6    Period  Weeks     Status  Achieved      PT LONG TERM GOAL #  2   Title  Pt will improve 5x sit<>stand test to less than or equal to 12.6 seconds for improved functional strength for improved transfers.  UPDATED TARGET 01/07/18    Baseline  15.13 12/08/17    Time  6 4 weeks per recert 4/81/85    Period  Weeks    Status  On-going    Target Date  01/07/18      PT LONG TERM GOAL #3   Title  Pt will improve DGI score to at least 19/24 for decreased fall risk.  UPDATED TARGET 01/07/18    Time  6 4    Period  Weeks    Status  On-going    Target Date  01/07/18      PT LONG TERM GOAL #4   Title  Pt will negotiate at least 4 steps with rails, step-through pattern, for improved independence with stair negotiation.    Time  6    Period  Weeks    Status  Partially Met      PT LONG TERM GOAL #5   Title  Pt will be independent with progressions of HEP for strength, balance.  TARGET 01/07/18    Time  4    Period  Weeks    Status  New    Target Date  01/07/18            Plan - 12/09/17 1255    Clinical Impression Statement  Assessed LTGs this visit, with pt meeting LTG 1 and LTG 4 partially met (pt reports stair negotiation is variable due to knee OA and pain).  LTG 2, 3 ongoing and LTG 5 new.  Pt has made progress, especially at her STG mark; however, she has slowed gait velocity, decreased TUG and DGI scores.  Pt feels she is weaker since IVIG and PT/patient feel that she would continue to benefit from skilled PT to further address strength, balance and gait to decrease fall risk.    PT Frequency  2x / week    PT Duration  4 weeks per recert 6/31/49    PT Treatment/Interventions  ADLs/Self Care Home Management;DME Instruction;Balance training;Therapeutic exercise;Therapeutic activities;Functional mobility training;Stair training;Gait training;Neuromuscular re-education;Patient/family education;Orthotic Fit/Training    PT Next Visit Plan  Updates to HEP to include standing HEP for strengthening and balance     Consulted and Agree with Plan of Care  Patient       Patient will benefit from skilled therapeutic intervention in order to improve the following deficits and impairments:  Abnormal gait, Decreased balance, Decreased endurance, Decreased mobility, Decreased safety awareness, Difficulty walking, Decreased strength, Postural dysfunction  Visit Diagnosis: Muscle weakness (generalized)  Unsteadiness on feet  Other abnormalities of gait and mobility  Abnormal posture     Problem List Patient Active Problem List   Diagnosis Date Noted  . Wears glasses 02/17/2011  . Arthritis 02/17/2011  . Menopause 02/17/2011    Felisha Claytor W. 12/09/2017, 12:58 PM  Frazier Butt., PT   [facility] [address] [place], [place], [address] Phone: [phone]   Fax:  [phone]  Name: Rebecca Galloway MRN: [id number] Date of Birth: May 25, 1946

## 2017-12-10 NOTE — Addendum Note (Signed)
Addended by: Frazier Butt on: 12/10/2017 09:45 AM   Modules accepted: Orders

## 2017-12-20 ENCOUNTER — Ambulatory Visit: Payer: BC Managed Care – PPO | Admitting: Physical Therapy

## 2017-12-20 DIAGNOSIS — R2681 Unsteadiness on feet: Secondary | ICD-10-CM

## 2017-12-20 DIAGNOSIS — M6281 Muscle weakness (generalized): Secondary | ICD-10-CM

## 2017-12-20 DIAGNOSIS — R293 Abnormal posture: Secondary | ICD-10-CM

## 2017-12-20 DIAGNOSIS — R2689 Other abnormalities of gait and mobility: Secondary | ICD-10-CM

## 2017-12-20 NOTE — Patient Instructions (Addendum)
STAND BY COUNTER FOR SUPPORT  Tandem Stance    Right foot in front of left, heel touching toe both feet "straight ahead". Stand on Foot Triangle of Support with both feet. Balance in this position _30 seconds. Do with left foot in front of right.  Copyright  VHI. All rights reserved.  Tandem Walking    Walk with each foot directly in front of other, heel of one foot touching toes of other foot with each step. Both feet straight ahead. Forward then backwards.   Copyright  VHI. All rights reserved.    Single Leg - Eyes Open    Holding support, lift right leg while maintaining balance over other leg. Progress to removing hands from support surface for longer periods of time. Hold_15___ seconds. Repeat __3__ times per session. Do __1-2__ sessions per day.  Copyright  VHI. All rights reserved.  PRE GAIT: Marching    March forward along counter. by lifting left leg, then right. Alternate. __4_ reps per set, _1-2 sets per day, _most__ days per week Hold onto a support.  Copyright  VHI. All rights reserved.  Leg Flexion    Inhale. While exhaling, lift one ankle toward buttocks, keeping knees together. Slowly return to starting position. Repeat _10___ times each leg. Do _1-2___ sets per session. Do 1____ sessions per day.   Copyright  VHI. All rights reserved.  ABDUCTION: Standing (Active)    Stand, feet flat. Lift right leg out to side.  Complete _1-2__ sets of _10__ repetitions. Perform __1_ sessions per day.  http://gtsc.exer.us/111   Copyright  VHI. All rights reserved.

## 2017-12-20 NOTE — Therapy (Signed)
[facility] [address] [place], [place], [address] Phone: [phone]   Fax:  [phone]  Physical Therapy Treatment  Patient Details  Name: Rebecca Galloway MRN: [id number] Date of Birth: 05/24/1946 Referring Provider: Nicki Guadalajara   Encounter Date: 12/20/2017  PT End of Session - 12/20/17 0931    Visit Number  13    Number of Visits  16 per recert 7/93/90    Date for PT Re-Evaluation  12/21/17    Authorization Type  BCBS    PT Start Time  0847    PT Stop Time  0926    PT Time Calculation (min)  39 min    Activity Tolerance  Patient tolerated treatment well    Behavior During Therapy  [facility] for tasks assessed/performed       Past Medical History:  Diagnosis Date  . Hyperthyroidism    pt states is presently being worked up and was told she has hyperthyroidism, no meds  . Osteopenia   . Tendonitis     Past Surgical History:  Procedure Laterality Date  . CESAREAN SECTION  1986  . CESAREAN SECTION    . LAPAROSCOPY  03/12/2011   Procedure: LAPAROSCOPY OPERATIVE;  Surgeon: Melina Schools, MD;  Location: [facility];  Service: Gynecology;  Laterality: Left;  operative laparoscopy with left salping-oophorectomy    There were no vitals filed for this visit.  Subjective Assessment - 12/20/17 0850    Subjective  Still feel fatigued from the treatment but feels like she is coming around (energy wise) since the treatment.    Pertinent History  OA bilateral knees, pre-diabetes, thyroid disease; Dr. Hassell Halim note says transverse myelitis?; IVIG treatments 11/29/17, 11/30/17    Patient Stated Goals  Pt's goal for physical therapy are to help with strengthening and manage symptoms of weakness.    Currently in Pain?  Yes    Pain Score  6     Pain Location  Knee    Pain Orientation  Right;Left    Pain Descriptors / Indicators  Jabbing    Pain Type  Chronic pain    Aggravating Factors   going up stairs    Pain Relieving  Factors  rest                       [facility] Adult PT Treatment/Exercise - 12/20/17 0001      Knee/Hip Exercises: Standing   Knee Flexion  Strengthening;Both;1 set;10 reps    Hip Abduction  Stengthening;Both;1 set;10 reps          Balance Exercises - 12/20/17 0901      Balance Exercises: Standing   Tandem Stance  Eyes open;Intermittent upper extremity support;4 reps;30 secs    SLS  Eyes open;Intermittent upper extremity support;15 secs;5 reps    Tandem Gait  Forward;Retro;Intermittent upper extremity support    Marching Limitations  able to march forward with one UE support along counter x4        PT Education - 12/20/17 0926    Education provided  Yes    Education Details  Updated HEP with standing strength and balance training    Person(s) Educated  Patient    Methods  Explanation;Demonstration;Verbal cues;Handout    Comprehension  Verbalized understanding;Returned demonstration       PT Short Term Goals - 11/17/17 1616      PT SHORT TERM GOAL #1   Title  Pt will be independent with HEP for improved strength,  balance, gait.  TARGET 11/19/17    Status  Achieved      PT SHORT TERM GOAL #2   Title  Pt will improve 5x sit<>stand to less than or equal to 15 seconds, to demonstrate improved functional strength for improved trasnfers.    Status  Achieved      PT SHORT TERM GOAL #3   Title  Pt will improve Dynamic Gait Index score to at least 15/24 for decreased fall risk.    Status  Achieved      PT SHORT TERM GOAL #4   Title  Pt will improve TUG score to less than or equal to 13 seconds for decreased fall risk.    Status  Achieved      PT SHORT TERM GOAL #5   Title  Pt will ambulate at least 500 ft, with appropriate assistive device, modified independently, for improved safety with community gait.    Status  Partially Met        PT Long Term Goals - 12/08/17 1023      PT LONG TERM GOAL #1   Title  Pt will verbalize understanding of fall prevention  in home/work environment.      Time  6    Period  Weeks    Status  Achieved      PT LONG TERM GOAL #2   Title  Pt will improve 5x sit<>stand test to less than or equal to 12.6 seconds for improved functional strength for improved transfers.  UPDATED TARGET 01/07/18    Baseline  15.13 12/08/17    Time  6 4 weeks per recert 04/02/20    Period  Weeks    Status  On-going    Target Date  01/07/18      PT LONG TERM GOAL #3   Title  Pt will improve DGI score to at least 19/24 for decreased fall risk.  UPDATED TARGET 01/07/18    Time  6 4    Period  Weeks    Status  On-going    Target Date  01/07/18      PT LONG TERM GOAL #4   Title  Pt will negotiate at least 4 steps with rails, step-through pattern, for improved independence with stair negotiation.    Time  6    Period  Weeks    Status  Partially Met      PT LONG TERM GOAL #5   Title  Pt will be independent with progressions of HEP for strength, balance.  TARGET 01/07/18    Time  4    Period  Weeks    Status  New    Target Date  01/07/18            Plan - 12/20/17 0931    Clinical Impression Statement  Updated HEP with standing LE strength and balance training. Pt required intermittent UE support with tandem and SLS stance activities and was challenged by standing AROM LE strength exercises.    PT Frequency  2x / week    PT Duration  4 weeks per recert 10/24/80    PT Treatment/Interventions  ADLs/Self Care Home Management;DME Instruction;Balance training;Therapeutic exercise;Therapeutic activities;Functional mobility training;Stair training;Gait training;Neuromuscular re-education;Patient/family education;Orthotic Fit/Training    PT Next Visit Plan  LE strengthening and standing balance with narrow BOS and compliant surfaces.    Consulted and Agree with Plan of Care  Patient       Patient will benefit from skilled therapeutic intervention in order to improve the  following deficits and impairments:  Abnormal gait, Decreased  balance, Decreased endurance, Decreased mobility, Decreased safety awareness, Difficulty walking, Decreased strength, Postural dysfunction  Visit Diagnosis: Muscle weakness (generalized)  Unsteadiness on feet  Other abnormalities of gait and mobility  Abnormal posture     Problem List Patient Active Problem List   Diagnosis Date Noted  . Wears glasses 02/17/2011  . Arthritis 02/17/2011  . Menopause 02/17/2011    Bjorn Loser, PTA  12/20/17, 9:38 AM [facility] [address] [place], [place], [address] Phone: [phone]   Fax:  [phone]  Name: SAMIRAH SCARPATI MRN: [id number] Date of Birth: 1946/02/25

## 2017-12-28 ENCOUNTER — Encounter: Payer: Self-pay | Admitting: Physical Therapy

## 2017-12-28 ENCOUNTER — Ambulatory Visit: Payer: BC Managed Care – PPO | Admitting: Physical Therapy

## 2017-12-28 DIAGNOSIS — R2681 Unsteadiness on feet: Secondary | ICD-10-CM

## 2017-12-28 DIAGNOSIS — M6281 Muscle weakness (generalized): Secondary | ICD-10-CM

## 2017-12-28 DIAGNOSIS — R2689 Other abnormalities of gait and mobility: Secondary | ICD-10-CM

## 2017-12-28 DIAGNOSIS — R293 Abnormal posture: Secondary | ICD-10-CM

## 2017-12-28 NOTE — Therapy (Signed)
[facility] [address] [place], [place], [address] Phone: [phone]   Fax:  [phone]  Physical Therapy Treatment  Patient Details  Name: Rebecca Galloway MRN: [id number] Date of Birth: October 23, 1945 Referring Provider: Nicki Guadalajara   Encounter Date: 12/28/2017  PT End of Session - 12/28/17 0852    Visit Number  14    Number of Visits  16 per recert 5/46/56    Date for PT Re-Evaluation  01/07/18    Authorization Type  BCBS    PT Start Time  [phone]    PT Stop Time  0930    PT Time Calculation (min)  41 min    Equipment Utilized During Treatment  Gait belt    Activity Tolerance  Patient tolerated treatment well    Behavior During Therapy  [facility] for tasks assessed/performed       Past Medical History:  Diagnosis Date  . Hyperthyroidism    pt states is presently being worked up and was told she has hyperthyroidism, no meds  . Osteopenia   . Tendonitis     Past Surgical History:  Procedure Laterality Date  . CESAREAN SECTION  1986  . CESAREAN SECTION    . LAPAROSCOPY  03/12/2011   Procedure: LAPAROSCOPY OPERATIVE;  Surgeon: Melina Schools, MD;  Location: [facility];  Service: Gynecology;  Laterality: Left;  operative laparoscopy with left salping-oophorectomy    There were no vitals filed for this visit.  Subjective Assessment - 12/28/17 0849    Subjective  Feeling back to her baseline now. No falls. No change in knee pain.     Pertinent History  OA bilateral knees, pre-diabetes, thyroid disease; Dr. Hassell Halim note says transverse myelitis?; IVIG treatments 11/29/17, 11/30/17    Patient Stated Goals  Pt's goal for physical therapy are to help with strengthening and manage symptoms of weakness.    Currently in Pain?  Yes    Pain Score  7  with movement only    Pain Location  Knee    Pain Orientation  Right;Left    Pain Descriptors / Indicators  Aching;Discomfort    Pain Type  Chronic pain    Pain Onset  More  than a month ago    Aggravating Factors   stairs    Pain Relieving Factors  rest, wear's knee braces          [facility] Adult PT Treatment/Exercise - 12/28/17 0911      High Level Balance   High Level Balance Activities  Side stepping;Tandem walking;Marching forwards;Marching backwards tandem/toe/heel fwd/bwd; side stepping in squat    High Level Balance Comments  red mats next to counter top: 3 laps each with min guard to min assist, occasional touch to counter for balance and cues on posture/ex form needed.       Knee/Hip Exercises: Aerobic   Elliptical  1 minute each fwd/bwd at level 1.0 with UE support          Balance Exercises - 12/28/17 0920      Balance Exercises: Standing   Standing Eyes Closed  Narrow base of support (BOS);Wide (BOA);Head turns;Foam/compliant surface;Other reps (comment);30 secs;Limitations    Partial Tandem Stance  Eyes closed;Foam/compliant surface;2 reps;15 secs;Limitations      Balance Exercises: Standing   Standing Eyes Closed Limitations  on open dense blue foam in corner with chair in front for safety: narrow base of support EC no head movements, progressing to wide base of support with EC head movements  left<>right, up<>down and diagonals each way. min guard to min assist. several instances of posterior and anterior balance losses with need for wall or chair touch to correct balance. cues needed on posture and weight shifting to assist with balance.      Tandem Stance Limitations  on open dense blue foam with chair in front for safety: modified tandem stance with EC x 2 reps each foot forward with min guard to min assist for balance, cues on posture/weight shiftng to assist with balance recovery.           PT Short Term Goals - 11/17/17 1616      PT SHORT TERM GOAL #1   Title  Pt will be independent with HEP for improved strength, balance, gait.  TARGET 11/19/17    Status  Achieved      PT SHORT TERM GOAL #2   Title  Pt will improve 5x  sit<>stand to less than or equal to 15 seconds, to demonstrate improved functional strength for improved trasnfers.    Status  Achieved      PT SHORT TERM GOAL #3   Title  Pt will improve Dynamic Gait Index score to at least 15/24 for decreased fall risk.    Status  Achieved      PT SHORT TERM GOAL #4   Title  Pt will improve TUG score to less than or equal to 13 seconds for decreased fall risk.    Status  Achieved      PT SHORT TERM GOAL #5   Title  Pt will ambulate at least 500 ft, with appropriate assistive device, modified independently, for improved safety with community gait.    Status  Partially Met        PT Long Term Goals - 12/08/17 1023      PT LONG TERM GOAL #1   Title  Pt will verbalize understanding of fall prevention in home/work environment.      Time  6    Period  Weeks    Status  Achieved      PT LONG TERM GOAL #2   Title  Pt will improve 5x sit<>stand test to less than or equal to 12.6 seconds for improved functional strength for improved transfers.  UPDATED TARGET 01/07/18    Baseline  15.13 12/08/17    Time  6 4 weeks per recert 03/16/95    Period  Weeks    Status  On-going    Target Date  01/07/18      PT LONG TERM GOAL #3   Title  Pt will improve DGI score to at least 19/24 for decreased fall risk.  UPDATED TARGET 01/07/18    Time  6 4    Period  Weeks    Status  On-going    Target Date  01/07/18      PT LONG TERM GOAL #4   Title  Pt will negotiate at least 4 steps with rails, step-through pattern, for improved independence with stair negotiation.    Time  6    Period  Weeks    Status  Partially Met      PT LONG TERM GOAL #5   Title  Pt will be independent with progressions of HEP for strength, balance.  TARGET 01/07/18    Time  4    Period  Weeks    Status  New    Target Date  01/07/18            Plan -  12/28/17 7129    Clinical Impression Statement  Today's skilled session focused on balance reactions with emphasis on compliant  surfaces and/or with vision removed. Muscle tremors visable as pt fatigued with activity, relieved with rest breaks. Pt is making steady progress as she was able to tolerate more high level activity today and should benefit from continued PT to progress toward unmet goals.                              PT Frequency  2x / week    PT Duration  4 weeks per recert 2/90/90    PT Treatment/Interventions  ADLs/Self Care Home Management;DME Instruction;Balance training;Therapeutic exercise;Therapeutic activities;Functional mobility training;Stair training;Gait training;Neuromuscular re-education;Patient/family education;Orthotic Fit/Training    PT Next Visit Plan  LE strengthening and standing balance with narrow BOS and compliant surfaces.    Consulted and Agree with Plan of Care  Patient       Patient will benefit from skilled therapeutic intervention in order to improve the following deficits and impairments:  Abnormal gait, Decreased balance, Decreased endurance, Decreased mobility, Decreased safety awareness, Difficulty walking, Decreased strength, Postural dysfunction  Visit Diagnosis: Muscle weakness (generalized)  Unsteadiness on feet  Other abnormalities of gait and mobility  Abnormal posture     Problem List Patient Active Problem List   Diagnosis Date Noted  . Wears glasses 02/17/2011  . Arthritis 02/17/2011  . Menopause 02/17/2011    Willow Ora, PTA, [facility] [address], [place], [place] [address] [phone] 12/28/17, 8:55 PM   Name: ARDEL JAGGER MRN: [id number] Date of Birth: Mar 14, 1946

## 2018-01-06 ENCOUNTER — Ambulatory Visit: Payer: BC Managed Care – PPO | Attending: Neurology | Admitting: Physical Therapy

## 2018-01-06 ENCOUNTER — Encounter: Payer: Self-pay | Admitting: Physical Therapy

## 2018-01-06 DIAGNOSIS — R2689 Other abnormalities of gait and mobility: Secondary | ICD-10-CM | POA: Diagnosis present

## 2018-01-06 DIAGNOSIS — R2681 Unsteadiness on feet: Secondary | ICD-10-CM | POA: Diagnosis present

## 2018-01-07 NOTE — Therapy (Signed)
[facility] [address] [place], [place], [address] Phone: [phone]   Fax:  [phone]  Physical Therapy Treatment  Patient Details  Name: Rebecca Galloway MRN: [id number] Date of Birth: 1946-07-06 Referring Provider: Nicki Guadalajara   Encounter Date: 01/06/2018  PT End of Session - 01/07/18 1137    Visit Number  15    Number of Visits  16 per recert 11/13/38    Date for PT Re-Evaluation  01/07/18    Authorization Type  BCBS    PT Start Time  0933    PT Stop Time  1014    PT Time Calculation (min)  41 min    Equipment Utilized During Treatment  Gait belt    Activity Tolerance  Patient tolerated treatment well    Behavior During Therapy  [facility] for tasks assessed/performed       Past Medical History:  Diagnosis Date  . Hyperthyroidism    pt states is presently being worked up and was told she has hyperthyroidism, no meds  . Osteopenia   . Tendonitis     Past Surgical History:  Procedure Laterality Date  . CESAREAN SECTION  1986  . CESAREAN SECTION    . LAPAROSCOPY  03/12/2011   Procedure: LAPAROSCOPY OPERATIVE;  Surgeon: Melina Schools, MD;  Location: [facility];  Service: Gynecology;  Laterality: Left;  operative laparoscopy with left salping-oophorectomy    There were no vitals filed for this visit.  Subjective Assessment - 01/06/18 0934    Subjective  Rushed out of the house Tuesday without knee braces and was afraid my knees would buckle.  I did fine and didn't wear the braces yesterday or today.  The shaking in my legs tends to come when I am fatigued.    Pertinent History  OA bilateral knees, pre-diabetes, thyroid disease; Dr. Hassell Halim note says transverse myelitis?; IVIG treatments 11/29/17, 11/30/17    Patient Stated Goals  Pt's goal for physical therapy are to help with strengthening and manage symptoms of weakness.    Currently in Pain?  No/denies    Pain Onset  More than a month ago                        [facility] Adult PT Treatment/Exercise - 01/07/18 0001      High Level Balance   High Level Balance Comments  Gait in gym area along hallways, 20-30 ft, 5 reps each with head nods, then with head turns for improved environmental scanning.  Obstacle negotiation, stepping over and stepping around obstacles, no LOB noted.          Balance Exercises - 01/06/18 0945      Balance Exercises: Standing   Standing Eyes Closed  Narrow base of support (BOS);Foam/compliant surface;Head turns;2 reps;10 secs Head nods x 5-10 reps each; EC hold steady 10 sec, 2 reps    SLS with Vectors  Solid surface Alt step taps to 6", 12" step fwd, then 6" step side, 10 rep    Gait with Head Turns  Forward;Intermittent upper extremity support;Foam/compliant surface;4 reps along mat surface 20 ft each    Tandem Gait  Forward;Intermittent upper extremity support;Foam/compliant surface;4 reps then progress to tandem march, 4 reps x 20 ft at counter    Partial Tandem Stance  Eyes open;Eyes closed;Foam/compliant surface head turns, nods; then EC x 2 reps 10 sec    Retro Gait  Foam/compliant surface;4 reps forward/ back walking  Sidestepping  3 reps;Theraband green theraband, R and L x 12 ft each direction    Turning  -- Figure-8 turns on red mat surface x 2 reps    Marching Limitations  Forward marching/back marching along counter on red mats 4 reps    Other Standing Exercises  Compliant surface work performed on red mats today; at end of session:  standing on incline and on decline of ramp:  EO with head turns and head nods x 5 reps each, then EC x 10 second hold with head steady, 1 rep with close supervision      Pt not wearing knee braces today.  No episodes of knee buckling noted.    PT Short Term Goals - 11/17/17 1616      PT SHORT TERM GOAL #1   Title  Pt will be independent with HEP for improved strength, balance, gait.  TARGET 11/19/17    Status  Achieved      PT SHORT TERM  GOAL #2   Title  Pt will improve 5x sit<>stand to less than or equal to 15 seconds, to demonstrate improved functional strength for improved trasnfers.    Status  Achieved      PT SHORT TERM GOAL #3   Title  Pt will improve Dynamic Gait Index score to at least 15/24 for decreased fall risk.    Status  Achieved      PT SHORT TERM GOAL #4   Title  Pt will improve TUG score to less than or equal to 13 seconds for decreased fall risk.    Status  Achieved      PT SHORT TERM GOAL #5   Title  Pt will ambulate at least 500 ft, with appropriate assistive device, modified independently, for improved safety with community gait.    Status  Partially Met        PT Long Term Goals - 12/08/17 1023      PT LONG TERM GOAL #1   Title  Pt will verbalize understanding of fall prevention in home/work environment.      Time  6    Period  Weeks    Status  Achieved      PT LONG TERM GOAL #2   Title  Pt will improve 5x sit<>stand test to less than or equal to 12.6 seconds for improved functional strength for improved transfers.  UPDATED TARGET 01/07/18    Baseline  15.13 12/08/17    Time  6 4 weeks per recert 05/29/56    Period  Weeks    Status  On-going    Target Date  01/07/18      PT LONG TERM GOAL #3   Title  Pt will improve DGI score to at least 19/24 for decreased fall risk.  UPDATED TARGET 01/07/18    Time  6 4    Period  Weeks    Status  On-going    Target Date  01/07/18      PT LONG TERM GOAL #4   Title  Pt will negotiate at least 4 steps with rails, step-through pattern, for improved independence with stair negotiation.    Time  6    Period  Weeks    Status  Partially Met      PT LONG TERM GOAL #5   Title  Pt will be independent with progressions of HEP for strength, balance.  TARGET 01/07/18    Time  4    Period  Weeks    Status  New    Target Date  01/07/18            Plan - 01/07/18 1138    Clinical Impression Statement  Continued focus on dynamic balance activities on  compliant surfaces with and without vision removed.  Pt continues to demonstrate improved stability, with less muscle tremors than in the past, and less Trendelenburg gait/trunk sway with gait.  Pt is continueing to progress with therapy towards her LTGs.    PT Frequency  2x / week    PT Duration  4 weeks per recert 5/73/22    PT Treatment/Interventions  ADLs/Self Care Home Management;DME Instruction;Balance training;Therapeutic exercise;Therapeutic activities;Functional mobility training;Stair training;Gait training;Neuromuscular re-education;Patient/family education;Orthotic Fit/Training    PT Next Visit Plan  Check LTGs and plan for d/c next visit.    Consulted and Agree with Plan of Care  Patient       Patient will benefit from skilled therapeutic intervention in order to improve the following deficits and impairments:  Abnormal gait, Decreased balance, Decreased endurance, Decreased mobility, Decreased safety awareness, Difficulty walking, Decreased strength, Postural dysfunction  Visit Diagnosis: Unsteadiness on feet  Other abnormalities of gait and mobility     Problem List Patient Active Problem List   Diagnosis Date Noted  . Wears glasses 02/17/2011  . Arthritis 02/17/2011  . Menopause 02/17/2011    Eero Dini W. 01/07/2018, 11:40 AM  Frazier Butt., PT   [facility] [address] [place], [place], [address] Phone: [phone]   Fax:  [phone]  Name: SIMONNE BOULOS MRN: [id number] Date of Birth: 26-Aug-1946

## 2018-01-13 ENCOUNTER — Ambulatory Visit: Payer: BC Managed Care – PPO | Admitting: Physical Therapy

## 2018-01-13 ENCOUNTER — Encounter: Payer: Self-pay | Admitting: Physical Therapy

## 2018-01-13 DIAGNOSIS — R2681 Unsteadiness on feet: Secondary | ICD-10-CM | POA: Diagnosis not present

## 2018-01-13 DIAGNOSIS — R2689 Other abnormalities of gait and mobility: Secondary | ICD-10-CM

## 2018-01-13 NOTE — Therapy (Signed)
[facility] [address] [place], [place], [address] Phone: [phone]   Fax:  [phone]  Physical Therapy Treatment  Patient Details  Name: Rebecca Galloway MRN: [id number] Date of Birth: 12-Apr-1946 Referring Provider: Nicki Guadalajara   Encounter Date: 01/13/2018  PT End of Session - 01/13/18 0925    Visit Number  16    Number of Visits  16 per recert 0/27/74    Date for PT Re-Evaluation  01/07/18    Authorization Type  BCBS    PT Start Time  0848    PT Stop Time  0912    PT Time Calculation (min)  24 min    Equipment Utilized During Treatment  --    Activity Tolerance  Patient tolerated treatment well    Behavior During Therapy  [facility] for tasks assessed/performed       Past Medical History:  Diagnosis Date  . Hyperthyroidism    pt states is presently being worked up and was told she has hyperthyroidism, no meds  . Osteopenia   . Tendonitis     Past Surgical History:  Procedure Laterality Date  . CESAREAN SECTION  1986  . CESAREAN SECTION    . LAPAROSCOPY  03/12/2011   Procedure: LAPAROSCOPY OPERATIVE;  Surgeon: Melina Schools, MD;  Location: [facility];  Service: Gynecology;  Laterality: Left;  operative laparoscopy with left salping-oophorectomy    There were no vitals filed for this visit.  Subjective Assessment - 01/13/18 0850    Subjective  WEaring the one knee brace on the right due to the knee is "catching" due to arthritis.    Pertinent History  OA bilateral knees, pre-diabetes, thyroid disease; Dr. Hassell Halim note says transverse myelitis?; IVIG treatments 11/29/17, 11/30/17    Patient Stated Goals  Pt's goal for physical therapy are to help with strengthening and manage symptoms of weakness.    Currently in Pain?  No/denies    Pain Onset  More than a month ago                       [facility] Adult PT Treatment/Exercise - 01/13/18 0001      Transfers   Transfers  Sit to Stand;Stand to  Sit    Sit to Stand  6: Modified independent (Device/Increase time);Without upper extremity assist;From chair/3-in-1;From bed    Five time sit to stand comments   13.31    Stand to Sit  6: Modified independent (Device/Increase time);Without upper extremity assist;To chair/3-in-1;To bed;Uncontrolled descent      Ambulation/Gait   Ambulation/Gait  Yes    Ambulation/Gait Assistance  7: Independent    Ambulation Distance (Feet)  200 Feet x 2    Assistive device  None    Gait Pattern  Step-through pattern;Narrow base of support    Ambulation Surface  Level;Indoor    Stairs  Yes    Stair Management Technique  One rail Right;Alternating pattern;Forwards    Number of Stairs  4    Height of Stairs  6      Dynamic Gait Index   Level Surface  Normal    Change in Gait Speed  Normal    Gait with Horizontal Head Turns  Mild Impairment    Gait with Vertical Head Turns  Mild Impairment    Gait and Pivot Turn  Normal    Step Over Obstacle  Mild Impairment    Step Around Obstacles  Normal    Steps  Mild  Impairment    Total Score  20      High Level Balance   High Level Balance Comments  Reviewed updated HEP:  tandem stance, SLS, tandem gait, marching forwards, hamstring curls standing, and hip abduction standing.  Pt performs HEP independently; PT does provide cues for use of UE support if needed; ways to progress for balance/strength; also performed head turns/head nods with gait x 50 ft each, no LOB.  SLS RLE 7.5 sec, LLE 10 seconds.      Self-Care   Self-Care  Other Self-Care Comments    Other Self-Care Comments   Discussed progress towards goals, plans for d/c this visit.  Briefly reviewed fall prevention, need for continued performance of HEP, and options for return to community fitness. Pt in agreement with d/c.             PT Education - 01/13/18 0925    Education provided  Yes    Education Details  Progress towards goals, plans for d/c this visit-see self care    Person(s)  Educated  Patient    Methods  Explanation    Comprehension  Verbalized understanding       PT Short Term Goals - 11/17/17 1616      PT SHORT TERM GOAL #1   Title  Pt will be independent with HEP for improved strength, balance, gait.  TARGET 11/19/17    Status  Achieved      PT SHORT TERM GOAL #2   Title  Pt will improve 5x sit<>stand to less than or equal to 15 seconds, to demonstrate improved functional strength for improved trasnfers.    Status  Achieved      PT SHORT TERM GOAL #3   Title  Pt will improve Dynamic Gait Index score to at least 15/24 for decreased fall risk.    Status  Achieved      PT SHORT TERM GOAL #4   Title  Pt will improve TUG score to less than or equal to 13 seconds for decreased fall risk.    Status  Achieved      PT SHORT TERM GOAL #5   Title  Pt will ambulate at least 500 ft, with appropriate assistive device, modified independently, for improved safety with community gait.    Status  Partially Met        PT Long Term Goals - 01/13/18 0851      PT LONG TERM GOAL #1   Title  Pt will verbalize understanding of fall prevention in home/work environment.      Time  6    Period  Weeks    Status  Achieved      PT LONG TERM GOAL #2   Title  Pt will improve 5x sit<>stand test to less than or equal to 12.6 seconds for improved functional strength for improved transfers.  UPDATED TARGET 01/07/18    Baseline  15.13 12/08/17; 13.32 seconds 01/13/18    Time  6 4 weeks per recert 3/78/58    Period  Weeks    Status  Not Met      PT LONG TERM GOAL #3   Title  Pt will improve DGI score to at least 19/24 for decreased fall risk.  UPDATED TARGET 01/07/18    Time  6 4    Period  Weeks    Status  Achieved      PT LONG TERM GOAL #4   Title  Pt will negotiate at least 4 steps with  rails, step-through pattern, for improved independence with stair negotiation.    Time  6    Period  Weeks    Status  Achieved      PT LONG TERM GOAL #5   Title  Pt will be  independent with progressions of HEP for strength, balance.  TARGET 01/07/18    Time  4    Period  Weeks    Status  Achieved            Plan - 01/13/18 9371    Clinical Impression Statement  Assessed LTGs this visit, iwth pt meeting LTG 1, 3, 4, 5.  LTG 2 not met, but improved with 5x sit<>Stand from 15.13 sec last check to 13.31 seconds today.  Pt overall is ambulating with improved steadiness and decreased fatigue.  She has improved transfers and dynamic gait throughout the course of PT, and she is appropriate for d/c from PT at this time.    PT Frequency  2x / week    PT Duration  4 weeks per recert 6/96/78    PT Treatment/Interventions  ADLs/Self Care Home Management;DME Instruction;Balance training;Therapeutic exercise;Therapeutic activities;Functional mobility training;Stair training;Gait training;Neuromuscular re-education;Patient/family education;Orthotic Fit/Training    PT Next Visit Plan  Discharge this visit.    Consulted and Agree with Plan of Care  Patient       Patient will benefit from skilled therapeutic intervention in order to improve the following deficits and impairments:  Abnormal gait, Decreased balance, Decreased endurance, Decreased mobility, Decreased safety awareness, Difficulty walking, Decreased strength, Postural dysfunction  Visit Diagnosis: Unsteadiness on feet  Other abnormalities of gait and mobility     Problem List Patient Active Problem List   Diagnosis Date Noted  . Wears glasses 02/17/2011  . Arthritis 02/17/2011  . Menopause 02/17/2011    Tamala Manzer W. 01/13/2018, 9:29 AM  Frazier Butt., PT   [facility] [address] [place], [place], [address] Phone: [phone]   Fax:  [phone]  Name: Rebecca Galloway MRN: [id number] Date of Birth: 1946/06/11  PHYSICAL THERAPY DISCHARGE SUMMARY  Visits from Start of Care: 16  Current functional level related to goals /  functional outcomes: PT Long Term Goals - 01/13/18 0851      PT LONG TERM GOAL #1   Title  Pt will verbalize understanding of fall prevention in home/work environment.      Time  6    Period  Weeks    Status  Achieved      PT LONG TERM GOAL #2   Title  Pt will improve 5x sit<>stand test to less than or equal to 12.6 seconds for improved functional strength for improved transfers.  UPDATED TARGET 01/07/18    Baseline  15.13 12/08/17; 13.32 seconds 01/13/18    Time  6 4 weeks per recert 8/67/61    Period  Weeks    Status  Not Met      PT LONG TERM GOAL #3   Title  Pt will improve DGI score to at least 19/24 for decreased fall risk.  UPDATED TARGET 01/07/18    Time  6 4    Period  Weeks    Status  Achieved      PT LONG TERM GOAL #4   Title  Pt will negotiate at least 4 steps with rails, step-through pattern, for improved independence with stair negotiation.    Time  6    Period  Weeks    Status  Achieved  PT LONG TERM GOAL #5   Title  Pt will be independent with progressions of HEP for strength, balance.  TARGET 01/07/18    Time  4    Period  Weeks    Status  Achieved      Pt has met 4 of 5 long term goals.   Remaining deficits: Balance, muscle weakness, endurance (all improved)   Education / Equipment: HEP, fall prevention  Plan: Patient agrees to discharge.  Patient goals were partially met. Patient is being discharged due to being pleased with the current functional level.  ?????    Mady Haagensen, PT 01/13/18 9:31 AM Phone: [phone] Fax: [phone]

## 2018-06-04 LAB — GLUCOSE, POCT (MANUAL RESULT ENTRY): POC GLUCOSE: 65 mg/dL — AB (ref 70–99)

## 2019-03-10 ENCOUNTER — Encounter: Payer: Self-pay | Admitting: Gastroenterology

## 2019-03-14 ENCOUNTER — Other Ambulatory Visit: Payer: Self-pay | Admitting: *Deleted

## 2019-03-14 DIAGNOSIS — Z20822 Contact with and (suspected) exposure to covid-19: Secondary | ICD-10-CM

## 2019-03-19 LAB — NOVEL CORONAVIRUS, NAA: SARS-CoV-2, NAA: NOT DETECTED

## 2019-03-20 ENCOUNTER — Telehealth: Payer: Self-pay | Admitting: General Practice

## 2019-03-20 NOTE — Telephone Encounter (Signed)
Pt called in and gave her NEG Covid results, She expressed understanding

## 2021-02-03 ENCOUNTER — Encounter: Payer: Self-pay | Admitting: Gastroenterology

## 2021-02-28 DIAGNOSIS — U071 COVID-19: Secondary | ICD-10-CM

## 2021-02-28 HISTORY — DX: COVID-19: U07.1

## 2021-03-10 ENCOUNTER — Ambulatory Visit (AMBULATORY_SURGERY_CENTER): Payer: BC Managed Care – PPO | Admitting: *Deleted

## 2021-03-10 ENCOUNTER — Other Ambulatory Visit: Payer: Self-pay

## 2021-03-10 VITALS — Ht 64.0 in | Wt 145.0 lb

## 2021-03-10 DIAGNOSIS — Z86010 Personal history of colonic polyps: Secondary | ICD-10-CM

## 2021-03-10 NOTE — Progress Notes (Signed)
Patient unable to tolerate suprep, requested something with minimal taste. Instructions for Miralax/gatorade.   No egg or soy allergy known to patient  No issues with past sedation with any surgeries or procedures Patient denies ever being told they had issues or difficulty with intubation  No FH of Malignant Hyperthermia No diet pills per patient No home 02 use per patient  No blood thinners per patient  Pt denies issues with constipation  No A fib or A flutter  EMMI video to pt or via [place] 19 guidelines implemented in [place] today with Pt and RN  Pt is fully vaccinated  for Covid     Due to the COVID-19 pandemic we are asking patients to follow certain guidelines.  Pt aware of COVID protocols and [facility] guidelines

## 2021-03-14 ENCOUNTER — Telehealth: Payer: Self-pay | Admitting: Gastroenterology

## 2021-03-14 NOTE — Telephone Encounter (Signed)
Pt and I changed the dates on the prep from PM procedure to am times - wed 7-20 the same  7-21 Thursday - Pt to start drinking at 630 am, stop all liquids at 830 am - make sure to drink and stay well hydrated WED thru the day and drink until 830 am THURS- drink liquids with some sugar and sodium until 830 am WED - pt verbalized understanding

## 2021-03-14 NOTE — Telephone Encounter (Signed)
Inbound call from patient. Changed her procedure appointment to a morning appointment so patient would need new instruction times.   Also she would like a call back to discuss the fasting portion of the prep because she gets dizzy, light headed, and feel as if she is going to faint if she does not eat something as soon as she gets up. Best contact number [phone]

## 2021-03-20 ENCOUNTER — Ambulatory Visit (AMBULATORY_SURGERY_CENTER): Payer: BC Managed Care – PPO | Admitting: Gastroenterology

## 2021-03-20 ENCOUNTER — Other Ambulatory Visit: Payer: Self-pay

## 2021-03-20 ENCOUNTER — Encounter: Payer: BC Managed Care – PPO | Admitting: Gastroenterology

## 2021-03-20 ENCOUNTER — Encounter: Payer: Self-pay | Admitting: Gastroenterology

## 2021-03-20 VITALS — BP 108/74 | HR 74 | Temp 97.3°F | Resp 12 | Ht 64.0 in | Wt 145.0 lb

## 2021-03-20 DIAGNOSIS — D124 Benign neoplasm of descending colon: Secondary | ICD-10-CM

## 2021-03-20 DIAGNOSIS — K6389 Other specified diseases of intestine: Secondary | ICD-10-CM | POA: Diagnosis not present

## 2021-03-20 DIAGNOSIS — Z86010 Personal history of colonic polyps: Secondary | ICD-10-CM

## 2021-03-20 DIAGNOSIS — K635 Polyp of colon: Secondary | ICD-10-CM | POA: Diagnosis not present

## 2021-03-20 DIAGNOSIS — D122 Benign neoplasm of ascending colon: Secondary | ICD-10-CM

## 2021-03-20 MED ORDER — SODIUM CHLORIDE 0.9 % IV SOLN: 500.0000 mL | Freq: Once | INTRAVENOUS | Status: DC

## 2021-03-20 NOTE — Progress Notes (Signed)
VS by Gage  I have reviewed the patient's medical history in detail and updated the computerized patient record.  

## 2021-03-20 NOTE — Progress Notes (Signed)
A/ox3, pleased with MAC, report to RN 

## 2021-03-20 NOTE — Patient Instructions (Signed)
Please see handouts given to you on Polyps and Diverticulosis.  Thank you for letting us take care of your healthcare needs today.   YOU HAD AN ENDOSCOPIC PROCEDURE TODAY AT [facility]:   Refer to the procedure report that was given to you for any specific questions about what was found during the examination.  If the procedure report does not answer your questions, please call your gastroenterologist to clarify.  If you requested that your care partner not be given the details of your procedure findings, then the procedure report has been included in a sealed envelope for you to review at your convenience later.  YOU SHOULD EXPECT: Some feelings of bloating in the abdomen. Passage of more gas than usual.  Walking can help get rid of the air that was put into your GI tract during the procedure and reduce the bloating. If you had a lower endoscopy (such as a colonoscopy or flexible sigmoidoscopy) you may notice spotting of blood in your stool or on the toilet paper. If you underwent a bowel prep for your procedure, you may not have a normal bowel movement for a few days.  Please Note:  You might notice some irritation and congestion in your nose or some drainage.  This is from the oxygen used during your procedure.  There is no need for concern and it should clear up in a day or so.  SYMPTOMS TO REPORT IMMEDIATELY:  Following lower endoscopy (colonoscopy or flexible sigmoidoscopy):  Excessive amounts of blood in the stool  Significant tenderness or worsening of abdominal pains  Swelling of the abdomen that is new, acute  Fever of 100F or higher   For urgent or emergent issues, a gastroenterologist can be reached at any hour by calling [phone]. Do not use MyChart messaging for urgent concerns.    DIET:  We do recommend a small meal at first, but then you may proceed to your regular diet.  Drink plenty of fluids but you should avoid alcoholic beverages for 24  hours.  ACTIVITY:  You should plan to take it easy for the rest of today and you should NOT DRIVE or use heavy machinery until tomorrow (because of the sedation medicines used during the test).    FOLLOW UP: Our staff will call the number listed on your records 48-72 hours following your procedure to check on you and address any questions or concerns that you may have regarding the information given to you following your procedure. If we do not reach you, we will leave a message.  We will attempt to reach you two times.  During this call, we will ask if you have developed any symptoms of COVID 19. If you develop any symptoms (ie: fever, flu-like symptoms, shortness of breath, cough etc.) before then, please call [phone].  If you test positive for Covid 19 in the 2 weeks post procedure, please call and report this information to [place].    If any biopsies were taken you will be contacted by phone or by letter within the next 1-3 weeks.  Please call us at [phone] if you have not heard about the biopsies in 3 weeks.    SIGNATURES/CONFIDENTIALITY: You and/or your care partner have signed paperwork which will be entered into your electronic medical record.  These signatures attest to the fact that that the information above on your After Visit Summary has been reviewed and is understood.  Full responsibility of the confidentiality of this discharge information  lies with you and/or your care-partner.  

## 2021-03-20 NOTE — Op Note (Signed)
[place] Patient Name: Rebecca Galloway Procedure Date: 03/20/2021 11:36 AM MRN: [id number] Endoscopist: [place]. Loletha Carrow , MD Age: 75 Referring MD:  Date of Birth: 1945/12/02 Gender: Female Account #: [id number] Procedure:                Colonoscopy Indications:              Surveillance: Personal history of adenomatous                            polyps on last colonoscopy 5 years ago                           TA < 43m x 3 in 02/2016 no polyps 2007 Medicines:                Monitored Anesthesia Care Procedure:                Pre-Anesthesia Assessment:                           - Prior to the procedure, a History and Physical                            was performed, and patient medications and                            allergies were reviewed. The patient's tolerance of                            previous anesthesia was also reviewed. The risks                            and benefits of the procedure and the sedation                            options and risks were discussed with the patient.                            All questions were answered, and informed consent                            was obtained. Prior Anticoagulants: The patient has                            taken no previous anticoagulant or antiplatelet                            agents. ASA Grade Assessment: II - A patient with                            mild systemic disease. After reviewing the risks                            and benefits, the patient was deemed in  satisfactory condition to undergo the procedure.                           After obtaining informed consent, the colonoscope                            was passed under direct vision. Throughout the                            procedure, the patient's blood pressure, pulse, and                            oxygen saturations were monitored continuously. The                            Olympus CF-HQ190L ([id number])  Colonoscope was                            introduced through the anus and advanced to the the                            cecum, identified by appendiceal orifice and                            ileocecal valve. The colonoscopy was somewhat                            difficult due to a redundant colon and significant                            looping. Successful completion of the procedure was                            aided by using manual pressure. The patient                            tolerated the procedure well. The quality of the                            bowel preparation was good. The ileocecal valve,                            appendiceal orifice, and rectum were photographed. Scope In: 11:51:35 AM Scope Out: 12:13:42 PM Scope Withdrawal Time: 0 hours 13 minutes 1 second  Total Procedure Duration: 0 hours 22 minutes 7 seconds  Findings:                 The perianal and digital rectal examinations were                            normal.                           A 5 mm polyp was found in the ascending colon. The  polyp was sessile. The polyp was removed with a                            cold snare. Resection and retrieval were complete.                           A diminutive polyp was found in the proximal                            descending colon. The polyp was semi-sessile. The                            polyp was removed with a cold snare. Resection and                            retrieval were complete.                           A few diverticula were found in the left colon.                           The exam was otherwise without abnormality on                            direct and retroflexion views. Complications:            No immediate complications. Estimated Blood Loss:     Estimated blood loss was minimal. Impression:               - One 5 mm polyp in the ascending colon, removed                            with a cold snare. Resected  and retrieved.                           - One diminutive polyp in the proximal descending                            colon, removed with a cold snare. Resected and                            retrieved.                           - Diverticulosis in the left colon.                           - The examination was otherwise normal on direct                            and retroflexion views. Recommendation:           - Patient has a contact number available for  emergencies. The signs and symptoms of potential                            delayed complications were discussed with the                            patient. Return to normal activities tomorrow.                            Written discharge instructions were provided to the                            patient.                           - Resume previous diet.                           - Continue present medications.                           - Await pathology results.                           - No repeat surveillance colonoscopy recommended                            due to age, current guidelines and today's findings. Rebecca Calix L. Loletha Carrow, MD 03/20/2021 12:18:33 PM This report has been signed electronically.

## 2021-03-20 NOTE — Progress Notes (Signed)
Called to room to assist during endoscopic procedure.  Patient ID and intended procedure confirmed with present staff. Received instructions for my participation in the procedure from the performing physician.  

## 2021-03-27 ENCOUNTER — Encounter: Payer: Self-pay | Admitting: Gastroenterology
